# Patient Record
Sex: Female | Born: 2000 | Race: White | Hispanic: No | Marital: Single | Smoking: Never smoker
Health system: Southern US, Community
[De-identification: ages and names within clinical notes are randomized; demographics above are authoritative.]

## PROBLEM LIST (undated history)

## (undated) DIAGNOSIS — R0602 Shortness of breath: Secondary | ICD-10-CM

## (undated) DIAGNOSIS — M2550 Pain in unspecified joint: Secondary | ICD-10-CM

## (undated) DIAGNOSIS — F32A Depression, unspecified: Secondary | ICD-10-CM

## (undated) DIAGNOSIS — M549 Dorsalgia, unspecified: Secondary | ICD-10-CM

## (undated) DIAGNOSIS — T7840XA Allergy, unspecified, initial encounter: Secondary | ICD-10-CM

## (undated) DIAGNOSIS — L309 Dermatitis, unspecified: Secondary | ICD-10-CM

## (undated) DIAGNOSIS — K589 Irritable bowel syndrome without diarrhea: Secondary | ICD-10-CM

## (undated) DIAGNOSIS — D649 Anemia, unspecified: Secondary | ICD-10-CM

## (undated) HISTORY — DX: Irritable bowel syndrome, unspecified: K58.9

## (undated) HISTORY — DX: Pain in unspecified joint: M25.50

## (undated) HISTORY — DX: Dorsalgia, unspecified: M54.9

## (undated) HISTORY — DX: Depression, unspecified: F32.A

## (undated) HISTORY — DX: Shortness of breath: R06.02

## (undated) HISTORY — DX: Allergy, unspecified, initial encounter: T78.40XA

## (undated) HISTORY — PX: MYRINGOTOMY: SUR874

## (undated) HISTORY — DX: Anemia, unspecified: D64.9

---
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
## 2022-04-17 NOTE — Progress Notes (Unsigned)
HPI: [name] is a 21 y.o. female, who is here today with her mother for her routine physical.  Last CPE: > A year ago. She recently established with gyn.  She exercises regularly, walks her dog, runs on the treadmill, and active at school. In general she follows a healthful diet.  Chronic medical problems: Depression,anxiety, and asthme among some.  Immunization History  Administered Date(s) Administered   DTaP 02/07/2003, 04/10/2003, 06/19/2003, 07/10/2004, 01/25/2007   Hepatitis A 12/07/2005, 01/25/2007   Hepatitis B 22-Jul-2003, 01/08/2003, 10/04/2003   HiB (PRP-OMP) 02/07/2003, 04/10/2003, 06/19/2003, 03/19/2004   Hpv-Unspecified 04/29/2015, 07/04/2015, 11/04/2015   IPV 02/07/2003, 04/10/2003, 07/10/2004, 01/25/2007   Influenza,inj,Quad PF,6+ Mos 08/10/2017, 07/18/2019, 08/04/2021   Influenza,inj,quad, With Preservative 08/08/2014   MMR 12/24/2003, 01/25/2007   Meningococcal Conjugate 04/29/2015   Pneumococcal Conjugate-13 02/07/2003, 04/10/2003, 06/19/2003, 03/19/2004   Tdap 04/29/2015   Varicella 12/24/2003, 01/25/2007   Health Maintenance  Topic Date Due   Hepatitis C Screening  Never done   COVID-19 Vaccine (1) 05/06/2022 (Originally 06/10/2003)   HIV Screening  03/07/2023 (Originally 12/07/2017)   INFLUENZA VACCINE  04/28/2022   TETANUS/TDAP  04/28/2025   HPV VACCINES  Completed    Review of Systems  Constitutional:  Negative for activity change, appetite change and fever.  HENT:  Negative for hearing loss, mouth sores, sore throat and trouble swallowing.   Eyes:  Negative for redness and visual disturbance.  Respiratory:  Negative for cough, shortness of breath and wheezing.   Cardiovascular:  Negative for chest pain and leg swelling.  Gastrointestinal:  Negative for abdominal pain, nausea and vomiting.       No changes in bowel habits.  Endocrine: Negative for cold intolerance, heat intolerance, polydipsia, polyphagia and polyuria.  Genitourinary:   Negative for decreased urine volume, dysuria, hematuria, vaginal bleeding and vaginal discharge.  Musculoskeletal:  Negative for gait problem and myalgias.  Skin:  Negative for color change and rash.  Allergic/Immunologic: Negative for environmental allergies.  Neurological:  Negative for seizures, syncope, weakness and headaches.  Hematological:  Negative for adenopathy. Does not bruise/bleed easily.  Psychiatric/Behavioral:  Negative for confusion. The patient is not nervous/anxious.   All other systems reviewed and are negative.  Current Outpatient Medications on File Prior to Visit  Medication Sig Dispense Refill   venlafaxine XR (EFFEXOR-XR) 37.5 MG 24 hr capsule TAKE 1 CAPSULE BY MOUTH DAILY WITH BREAKFAST. 90 capsule 1   No current facility-administered medications on file prior to visit.   Past Medical History:  Diagnosis Date   Allergy    Eczema    Past Surgical History:  Procedure Laterality Date   MYRINGOTOMY     No Known Allergies  Family History  Problem Relation Age of Onset   Mental illness Mother    Hypertension Mother    Asthma Father    Social History   Socioeconomic History   Marital status: Single    Spouse name: Not on file   Number of children: Not on file   Years of education: Not on file   Highest education level: Some college, no degree  Occupational History   Not on file  Tobacco Use   Smoking status: Never   Smokeless tobacco: Never  Substance and Sexual Activity   Alcohol use: No   Drug use: No   Sexual activity: Never  Other Topics Concern   Not on file  Social History Narrative   Not on file   Social Determinants of Health   Financial Resource  Strain: Low Risk  (04/20/2022)   Overall Financial Resource Strain (CARDIA)    Difficulty of Paying Living Expenses: Not hard at all  Food Insecurity: No Food Insecurity (04/20/2022)   Hunger Vital Sign    Worried About Running Out of Food in the Last Year: Never true    Ran Out of Food  in the Last Year: Never true  Transportation Needs: No Transportation Needs (04/20/2022)   PRAPARE - Hydrologist (Medical): No    Lack of Transportation (Non-Medical): No  Physical Activity: Sufficiently Active (04/20/2022)   Exercise Vital Sign    Days of Exercise per Week: 3 days    Minutes of Exercise per Session: 60 min  Stress: Stress Concern Present (04/20/2022)   [place]    Feeling of Stress : Rather much  Social Connections: Unknown (04/20/2022)   Social Connection and Isolation Panel [NHANES]    Frequency of Communication with Friends and Family: More than three times a week    Frequency of Social Gatherings with Friends and Family: Once a week    Attends Religious Services: Patient refused    Active Member of Clubs or Organizations: No    Attends Archivist Meetings: Not on file    Marital Status: Patient refused   Vitals:   04/20/22 1351  BP: 132/80  Pulse: 72  Resp: 12  Temp: 98.3 F (36.8 C)  SpO2: 92%  Body mass index is 41.3 kg/m. Wt Readings from Last 3 Encounters:  04/20/22 258 lb 8 oz (117.3 kg) (>99 %, Z= 2.56)*  05/09/18 214 lb 2 oz (97.1 kg) (99 %, Z= 2.30)*  04/06/18 228 lb 8 oz (103.6 kg) (>99 %, Z= 2.46)*   * Growth percentiles are based on CDC (Girls, 2-20 Years) data.   Physical Exam Vitals and nursing note reviewed.  Constitutional:      General: She is not in acute distress.    Appearance: She is well-developed.  HENT:     Head: Normocephalic and atraumatic.     Right Ear: Hearing, tympanic membrane, ear canal and external ear normal.     Left Ear: Hearing, tympanic membrane, ear canal and external ear normal.     Mouth/Throat:     Mouth: Mucous membranes are moist.     Pharynx: Oropharynx is clear. Uvula midline.  Eyes:     Extraocular Movements: Extraocular movements intact.     Conjunctiva/sclera: Conjunctivae normal.     Pupils:  Pupils are equal, round, and reactive to light.  Neck:     Thyroid: No thyromegaly.     Trachea: No tracheal deviation.  Cardiovascular:     Rate and Rhythm: Normal rate and regular rhythm.     Pulses:          Dorsalis pedis pulses are 2+ on the right side and 2+ on the left side.     Heart sounds: No murmur heard. Pulmonary:     Effort: Pulmonary effort is normal. No respiratory distress.     Breath sounds: Normal breath sounds.  Abdominal:     Palpations: Abdomen is soft. There is no hepatomegaly or mass.     Tenderness: There is no abdominal tenderness.  Genitourinary:    Comments: Deferred to gyn. Musculoskeletal:     Comments: No major deformity or signs of synovitis appreciated.  Lymphadenopathy:     Cervical: No cervical adenopathy.     Upper Body:  Right upper body: No supraclavicular adenopathy.     Left upper body: No supraclavicular adenopathy.  Skin:    General: Skin is warm.     Findings: No erythema or rash.  Neurological:     General: No focal deficit present.     Mental Status: She is alert and oriented to person, place, and time.     Cranial Nerves: No cranial nerve deficit.     Coordination: Coordination normal.     Gait: Gait normal.     Deep Tendon Reflexes:     Reflex Scores:      Bicep reflexes are 2+ on the right side and 2+ on the left side.      Patellar reflexes are 2+ on the right side and 2+ on the left side. Psychiatric:        Mood and Affect: Mood is anxious.     Comments: Well groomed, good eye contact.   ASSESSMENT AND PLAN:  [name] was here today annual physical examination.  Orders Placed This Encounter  Procedures   Hepatitis C antibody screen   Basic metabolic panel   Hemoglobin A1c   Lipid panel   TSH   Hepatic function panel   Amb Ref to Medical Weight Management   Carol Walton was seen today for annual exam.  Diagnoses and all orders for this visit:  Routine general medical examination at a health care  facility  Depression, major, recurrent, mild ([place])  Encounter for HCV screening test for low risk patient -     Hepatitis C antibody screen; Future  Screening for endocrine, metabolic and immunity disorder -     Basic metabolic panel; Future -     Hemoglobin A1c; Future  Nausea and vomiting in adult patient -     TSH; Future -     Hepatic function panel; Future  Encounter for screening for lipid disorder -     Lipid panel; Future  FHx: hypercholesterolemia -     Lipid panel; Future  Morbid obesity ([place]) -     Amb Ref to Medical Weight Management  No problem-specific Assessment & Plan notes found for this encounter.  Return in 6 months (on 10/21/2022) for anxiety and depression.  Ronav Furney G. [place], MD  [facility]. [place] office.

## 2022-04-20 ENCOUNTER — Other Ambulatory Visit: Payer: Self-pay

## 2022-04-20 ENCOUNTER — Ambulatory Visit (INDEPENDENT_AMBULATORY_CARE_PROVIDER_SITE_OTHER): Payer: BC Managed Care – PPO | Admitting: Family Medicine

## 2022-04-20 VITALS — BP 132/80 | HR 72 | Temp 98.3°F | Resp 12 | Ht 66.34 in | Wt 258.5 lb

## 2022-04-20 DIAGNOSIS — Z130 Encounter for screening for diseases of the blood and blood-forming organs and certain disorders involving the immune mechanism: Secondary | ICD-10-CM

## 2022-04-20 DIAGNOSIS — Z13220 Encounter for screening for lipoid disorders: Secondary | ICD-10-CM | POA: Diagnosis not present

## 2022-04-20 DIAGNOSIS — F330 Major depressive disorder, recurrent, mild: Secondary | ICD-10-CM | POA: Diagnosis not present

## 2022-04-20 DIAGNOSIS — Z1329 Encounter for screening for other suspected endocrine disorder: Secondary | ICD-10-CM | POA: Diagnosis not present

## 2022-04-20 DIAGNOSIS — Z13228 Encounter for screening for other metabolic disorders: Secondary | ICD-10-CM

## 2022-04-20 DIAGNOSIS — Z1159 Encounter for screening for other viral diseases: Secondary | ICD-10-CM

## 2022-04-20 DIAGNOSIS — Z8342 Family history of familial hypercholesterolemia: Secondary | ICD-10-CM | POA: Diagnosis not present

## 2022-04-20 DIAGNOSIS — Z0000 Encounter for general adult medical examination without abnormal findings: Secondary | ICD-10-CM | POA: Diagnosis not present

## 2022-04-20 DIAGNOSIS — R112 Nausea with vomiting, unspecified: Secondary | ICD-10-CM

## 2022-04-20 LAB — LIPID PANEL
Cholesterol: 251 mg/dL — ABNORMAL HIGH (ref 0–200)
HDL: 50.8 mg/dL (ref >39.00)
LDL Cholesterol: 181 mg/dL — ABNORMAL HIGH (ref 0–99)
NonHDL: 200.36
Total CHOL/HDL Ratio: 5
Triglycerides: 98 mg/dL (ref 0.0–149.0)
VLDL: 19.6 mg/dL (ref 0.0–40.0)

## 2022-04-20 LAB — HEPATIC FUNCTION PANEL
ALT: 30 U/L (ref 0–35)
AST: 27 U/L (ref 0–37)
Albumin: 4.7 g/dL (ref 3.5–5.2)
Alkaline Phosphatase: 65 U/L (ref 47–119)
Bilirubin, Direct: 0.1 mg/dL (ref 0.0–0.3)
Total Bilirubin: 0.3 mg/dL (ref 0.2–1.2)
Total Protein: 8 g/dL (ref 6.0–8.3)

## 2022-04-20 LAB — BASIC METABOLIC PANEL
BUN: 9 mg/dL (ref 6–23)
CO2: 26 mEq/L (ref 19–32)
Calcium: 10 mg/dL (ref 8.4–10.5)
Chloride: 101 mEq/L (ref 96–112)
Creatinine, Ser: 0.63 mg/dL (ref 0.40–1.20)
GFR: 128.65 mL/min (ref >60.00)
Glucose, Bld: 89 mg/dL (ref 70–99)
Potassium: 4.7 mEq/L (ref 3.5–5.1)
Sodium: 137 mEq/L (ref 135–145)

## 2022-04-20 LAB — CBC
HCT: 40.5 % (ref 36.0–49.0)
Hemoglobin: 13.4 g/dL (ref 12.0–16.0)
MCHC: 33 g/dL (ref 31.0–37.0)
MCV: 80.1 fl (ref 78.0–98.0)
Platelets: 277 10*3/uL (ref 150.0–575.0)
RBC: 5.05 Mil/uL (ref 3.80–5.70)
RDW: 14 % (ref 11.4–15.5)
WBC: 9.6 10*3/uL (ref 4.5–13.5)

## 2022-04-20 LAB — HEMOGLOBIN A1C: Hgb A1c MFr Bld: 5.7 % (ref 4.6–6.5)

## 2022-04-20 LAB — TSH: TSH: 1.59 u[IU]/mL (ref 0.40–5.00)

## 2022-04-20 NOTE — Patient Instructions (Addendum)
A few things to remember from today's visit:  Routine general medical examination at a health care facility  Depression, major, recurrent, mild ([place])  Encounter for HCV screening test for low risk patient - Plan: Hepatitis C antibody screen  Screening for endocrine, metabolic and immunity disorder - Plan: Basic metabolic panel, Hemoglobin A1c  Nausea and vomiting in adult patient - Plan: TSH, Hepatic function panel, CBC  Encounter for screening for lipid disorder - Plan: Lipid panel  FHx: hypercholesterolemia - Plan: Lipid panel  Obesity ([place]) - Plan: Amb Ref to Medical Weight Management  If you need refills please call your pharmacy. Do not use My Chart to request refills or for acute issues that need immediate attention.   Please be sure medication list is accurate. If a new problem present, please set up appointment sooner than planned today.  Health Maintenance, Female Adopting a healthy lifestyle and getting preventive care are important in promoting health and wellness. Ask your health care provider about: The right schedule for you to have regular tests and exams. Things you can do on your own to prevent diseases and keep yourself healthy. What should I know about diet, weight, and exercise? Eat a healthy diet  Eat a diet that includes plenty of vegetables, fruits, low-fat dairy products, and lean protein. Do not eat a lot of foods that are high in solid fats, added sugars, or sodium. Maintain a healthy weight Body mass index (BMI) is used to identify weight problems. It estimates body fat based on height and weight. Your health care provider can help determine your BMI and help you achieve or maintain a healthy weight. Get regular exercise Get regular exercise. This is one of the most important things you can do for your health. Most adults should: Exercise for at least 150 minutes each week. The exercise should increase your heart rate and make you sweat  (moderate-intensity exercise). Do strengthening exercises at least twice a week. This is in addition to the moderate-intensity exercise. Spend less time sitting. Even light physical activity can be beneficial. Watch cholesterol and blood lipids Have your blood tested for lipids and cholesterol at 19 years of age, then have this test every 5 years. Have your cholesterol levels checked more often if: Your lipid or cholesterol levels are high. You are older than 19 years of age. You are at high risk for heart disease. What should I know about cancer screening? Depending on your health history and family history, you may need to have cancer screening at various ages. This may include screening for: Breast cancer. Cervical cancer. Colorectal cancer. Skin cancer. Lung cancer. What should I know about heart disease, diabetes, and high blood pressure? Blood pressure and heart disease High blood pressure causes heart disease and increases the risk of stroke. This is more likely to develop in people who have high blood pressure readings or are overweight. Have your blood pressure checked: Every 3-5 years if you are 71-41 years of age. Every year if you are 67 years old or older. Diabetes Have regular diabetes screenings. This checks your fasting blood sugar level. Have the screening done: Once every three years after age 57 if you are at a normal weight and have a low risk for diabetes. More often and at a younger age if you are overweight or have a high risk for diabetes. What should I know about preventing infection? Hepatitis B If you have a higher risk for hepatitis B, you should be screened for this  virus. Talk with your health care provider to find out if you are at risk for hepatitis B infection. Hepatitis C Testing is recommended for: Everyone born from [birth year] through 1963. Anyone with known risk factors for hepatitis C. Sexually transmitted infections (STIs) Get screened for STIs,  including gonorrhea and chlamydia, if: You are sexually active and are younger than 19 years of age. You are older than 19 years of age and your health care provider tells you that you are at risk for this type of infection. Your sexual activity has changed since you were last screened, and you are at increased risk for chlamydia or gonorrhea. Ask your health care provider if you are at risk. Ask your health care provider about whether you are at high risk for HIV. Your health care provider may recommend a prescription medicine to help prevent HIV infection. If you choose to take medicine to prevent HIV, you should first get tested for HIV. You should then be tested every 3 months for as long as you are taking the medicine. Pregnancy If you are about to stop having your period (premenopausal) and you may become pregnant, seek counseling before you get pregnant. Take 400 to 800 micrograms (mcg) of folic acid every day if you become pregnant. Ask for birth control (contraception) if you want to prevent pregnancy. Osteoporosis and menopause Osteoporosis is a disease in which the bones lose minerals and strength with aging. This can result in bone fractures. If you are 19 years old or older, or if you are at risk for osteoporosis and fractures, ask your health care provider if you should: Be screened for bone loss. Take a calcium or vitamin D supplement to lower your risk of fractures. Be given hormone replacement therapy (HRT) to treat symptoms of menopause. Follow these instructions at home: Alcohol use Do not drink alcohol if: Your health care provider tells you not to drink. You are pregnant, may be pregnant, or are planning to become pregnant. If you drink alcohol: Limit how much you have to: 0-1 drink a day. Know how much alcohol is in your drink. In the [place] one drink equals one 12 oz bottle of beer (355 mL), one 5 oz glass of wine (148 mL), or one 1 oz glass of hard liquor (44  mL). Lifestyle Do not use any products that contain nicotine or tobacco. These products include cigarettes, chewing tobacco, and vaping devices, such as e-cigarettes. If you need help quitting, ask your health care provider. Do not use street drugs. Do not share needles. Ask your health care provider for help if you need support or information about quitting drugs. General instructions Schedule regular health, dental, and eye exams. Stay current with your vaccines. Tell your health care provider if: You often feel depressed. You have ever been abused or do not feel safe at home. Summary Adopting a healthy lifestyle and getting preventive care are important in promoting health and wellness. Follow your health care provider's instructions about healthy diet, exercising, and getting tested or screened for diseases. Follow your health care provider's instructions on monitoring your cholesterol and blood pressure. This information is not intended to replace advice given to you by your health care provider. Make sure you discuss any questions you have with your health care provider. Document Revised: 02/03/2021 Document Reviewed: 02/03/2021 Elsevier Patient Education  2023 Elsevier Inc.   MyPlate from [employer] is an outline of a general healthy diet based on the Dietary Guidelines for Americans, 2020-2025,  from the [place] Department of Agriculture Architect). It sets guidelines for how much food you should eat from each food group based on your age, sex, and level of physical activity. What are tips for following MyPlate? To follow MyPlate recommendations: Eat a wide variety of fruits and vegetables, grains, and protein foods. Serve smaller portions and eat less food throughout the day. Limit portion sizes to avoid overeating. Enjoy your food. Get at least 150 minutes of exercise every week. This is about 30 minutes each day, 5 or more days per week. It can be difficult to have every meal  look like MyPlate. Think about MyPlate as eating guidelines for an entire day, rather than each individual meal. Fruits and vegetables Make one half of your plate fruits and vegetables. Eat many different colors of fruits and vegetables each day. For a 2,000-calorie daily food plan, eat: 2 cups of vegetables every day. 2 cups of fruit every day. 1 cup is equal to: 1 cup raw or cooked vegetables. 1 cup raw fruit. 1 medium-sized orange, apple, or banana. 1 cup 100% fruit or vegetable juice. 2 cups raw leafy greens, such as lettuce, spinach, or kale.  cup dried fruit. Grains One fourth of your plate should be grains. Make at least half of the grains you eat each day whole grains. For a 2,000-calorie daily food plan, eat 6 oz of grains every day. 1 oz is equal to: 1 slice bread. 1 cup cereal.  cup cooked rice, cereal, or pasta. Protein One fourth of your plate should be protein. Eat a wide variety of protein foods, including meat, poultry, fish, eggs, beans, nuts, and tofu. For a 2,000-calorie daily food plan, eat 5 oz of protein every day. 1 oz is equal to: 1 oz meat, poultry, or fish.  cup cooked beans. 1 egg.  oz nuts or seeds. 1 Tbsp peanut butter. Dairy Drink fat-free or low-fat (1%) milk. Eat or drink dairy as a side to meals. For a 2,000-calorie daily food plan, eat or drink 3 cups of dairy every day. 1 cup is equal to: 1 cup milk, yogurt, cottage cheese, or soy milk (soy beverage). 2 oz processed cheese. 1 oz natural cheese. Fats, oils, salt, and sugars Only small amounts of oils are recommended. Avoid foods that are high in calories and low in nutritional value (empty calories), like foods high in fat or added sugars. Choose foods that are low in salt (sodium). Choose foods that have less than 140 milligrams (mg) of sodium per serving. Drink water instead of sugary drinks. Drink enough fluid to keep your urine pale yellow. Where to find support Work with your  health care provider or a dietitian to develop a customized eating plan that is right for you. Download an app (mobile application) to help you track your daily food intake. Where to find more information USDA: https://www.[place].org/ Summary MyPlate is a general guideline for healthy eating from the USDA. It is based on the Dietary Guidelines for Americans, 2020-2025. In general, fruits and vegetables should take up one half of your plate, grains should take up one fourth of your plate, and protein should take up one fourth of your plate. This information is not intended to replace advice given to you by your health care provider. Make sure you discuss any questions you have with your health care provider. Document Revised: 08/05/2020 Document Reviewed: 08/05/2020 Elsevier Patient Education  2023 [employer].

## 2022-04-21 LAB — HEPATITIS C ANTIBODY: Hepatitis C Ab: NONREACTIVE

## 2022-04-22 ENCOUNTER — Encounter: Payer: Self-pay | Admitting: Family Medicine

## 2022-04-27 ENCOUNTER — Other Ambulatory Visit: Payer: Self-pay | Admitting: Family Medicine

## 2022-04-27 DIAGNOSIS — J4520 Mild intermittent asthma, uncomplicated: Secondary | ICD-10-CM

## 2022-04-27 MED ORDER — ALBUTEROL SULFATE HFA 108 (90 BASE) MCG/ACT IN AERS: 2.0000 | INHALATION_SPRAY | Freq: Four times a day (QID) | RESPIRATORY_TRACT | 1 refills | Status: DC | PRN

## 2022-05-07 DIAGNOSIS — Z0289 Encounter for other administrative examinations: Secondary | ICD-10-CM

## 2022-05-12 ENCOUNTER — Encounter (INDEPENDENT_AMBULATORY_CARE_PROVIDER_SITE_OTHER): Payer: Self-pay | Admitting: Family Medicine

## 2022-05-12 ENCOUNTER — Ambulatory Visit (INDEPENDENT_AMBULATORY_CARE_PROVIDER_SITE_OTHER): Payer: BC Managed Care – PPO | Admitting: Family Medicine

## 2022-05-12 VITALS — BP 115/74 | HR 95 | Temp 98.5°F | Ht 66.0 in | Wt 257.0 lb

## 2022-05-12 DIAGNOSIS — E7849 Other hyperlipidemia: Secondary | ICD-10-CM

## 2022-05-12 DIAGNOSIS — R0602 Shortness of breath: Secondary | ICD-10-CM

## 2022-05-12 DIAGNOSIS — R7303 Prediabetes: Secondary | ICD-10-CM

## 2022-05-12 DIAGNOSIS — D649 Anemia, unspecified: Secondary | ICD-10-CM

## 2022-05-12 DIAGNOSIS — R5383 Other fatigue: Secondary | ICD-10-CM | POA: Diagnosis not present

## 2022-05-12 DIAGNOSIS — Z1331 Encounter for screening for depression: Secondary | ICD-10-CM | POA: Diagnosis not present

## 2022-05-12 DIAGNOSIS — Z6841 Body Mass Index (BMI) 40.0 and over, adult: Secondary | ICD-10-CM

## 2022-05-12 DIAGNOSIS — F419 Anxiety disorder, unspecified: Secondary | ICD-10-CM

## 2022-05-12 MED ORDER — BUSPIRONE HCL 5 MG PO TABS: 5.0000 mg | ORAL_TABLET | Freq: Three times a day (TID) | ORAL | 0 refills | Status: DC | PRN

## 2022-05-13 ENCOUNTER — Telehealth: Payer: Self-pay | Admitting: Family Medicine

## 2022-05-13 LAB — ANEMIA PANEL
Ferritin: 27 ng/mL (ref 15–77)
Folate, Hemolysate: 380 ng/mL
Folate, RBC: 894 ng/mL (ref >498)
Hematocrit: 42.5 % (ref 34.0–46.6)
Iron Saturation: 8 % — CL (ref 15–55)
Iron: 25 ug/dL — ABNORMAL LOW (ref 27–159)
Retic Ct Pct: 1.5 % (ref 0.6–2.6)
Total Iron Binding Capacity: 332 ug/dL (ref 250–450)
UIBC: 307 ug/dL (ref 131–425)
Vitamin B-12: 446 pg/mL (ref 232–1245)

## 2022-05-13 LAB — FOLATE: Folate: 4.7 ng/mL (ref >3.0)

## 2022-05-13 LAB — T4, FREE: Free T4: 1.58 ng/dL (ref 0.93–1.60)

## 2022-05-13 LAB — INSULIN, RANDOM: INSULIN: 29.1 u[IU]/mL — ABNORMAL HIGH (ref 2.6–24.9)

## 2022-05-13 LAB — VITAMIN D 25 HYDROXY (VIT D DEFICIENCY, FRACTURES): Vit D, 25-Hydroxy: 55.1 ng/mL (ref 30.0–100.0)

## 2022-05-13 LAB — T3: T3, Total: 284 ng/dL — ABNORMAL HIGH (ref 71–180)

## 2022-05-13 NOTE — Telephone Encounter (Signed)
error 

## 2022-05-17 ENCOUNTER — Other Ambulatory Visit: Payer: Self-pay | Admitting: Family Medicine

## 2022-05-17 DIAGNOSIS — F419 Anxiety disorder, unspecified: Secondary | ICD-10-CM

## 2022-05-17 DIAGNOSIS — F330 Major depressive disorder, recurrent, mild: Secondary | ICD-10-CM

## 2022-05-20 NOTE — Progress Notes (Signed)
Chief Complaint:   OBESITY Carol Walton (MR# [id number]) is a 21 y.o. female who presents for evaluation and treatment of obesity and related comorbidities. Current BMI is Body mass index is 41.48 kg/m. Carol Walton has been struggling with her weight for many years and has been unsuccessful in either losing weight, maintaining weight loss, or reaching her healthy weight goal.  Carol Walton referred by Dr. Swaziland. She is on Adipex in 2019 and lost 14 lbs. She skips breakfast, 1/2 jug of water +/- glass of milk. Lunch at 12pm, Salmon and broccoli (3.5 oz and 1/2 cup) (satisfied). Snack at 3pm--fruit or chips and queso (1 cup chips and store brought queso). Dinner is burger-at 3 oz + provolone and broccoli or brussel sprouts (1 1/4 cups) (feels full). 9-10 pm, snack of keto ice cream bar or splitting bag of popcorn with dad or brother.  Carol Walton is currently in the action stage of change and ready to dedicate time achieving and maintaining a healthier weight. Carol Walton is interested in becoming our patient and working on intensive lifestyle modifications including (but not limited to) diet and exercise for weight loss.  Carol Walton's habits were reviewed today and are as follows: Her family eats meals together, she thinks her family will eat healthier with her, her desired weight loss is 58 lbs, she has been heavy most of her life, she started gaining weight in highschool, her heaviest weight ever was 258 pounds, she has significant food cravings issues, she snacks frequently in the evenings, she wakes up frequently in the middle of the night to eat, she frequently eats larger portions than normal, and she has binge eating behaviors.  Depression Screen Carol Walton's Food and Mood (modified PHQ-9) score was 11.     05/12/2022    9:18 AM  Depression screen PHQ 2/9  Decreased Interest 2  Down, Depressed, Hopeless 2  PHQ - 2 Score 4  Altered sleeping 1  Tired, decreased energy 3  Change in appetite 1  Feeling  bad or failure about yourself  2  Trouble concentrating 0  Moving slowly or fidgety/restless 0  Suicidal thoughts 0  PHQ-9 Score 11  Difficult doing work/chores Very difficult   Subjective:   1. Other fatigue Carol Walton admits to daytime somnolence and admits to waking up still tired. Patient has a history of symptoms of morning fatigue. Carol Walton generally gets 6 or 7 hours of sleep per night, and states that she has generally restful sleep. Snoring is not present. Apneic episodes are not present. Epworth Sleepiness Score is 8.   2. SOBOE (shortness of breath on exertion) Carol Walton notes increasing shortness of breath with exercising and seems to be worsening over time with weight gain. She notes getting out of breath sooner with activity than she used to. This has not gotten worse recently. Carol Walton denies shortness of breath at rest or orthopnea.  3. Prediabetes Carol Walton A1c was 5.7. Family history of diabetes without prior A1c.  4. Other hyperlipidemia Carol Walton's LDL at 181, HDL at 50, and Trigly at 98. Both her parents with elevated cholesterol and taking medication.  5. Anemia, unspecified type MCV borderline low H/H within normal limits. Notes fatigue.  6. Anxiety Carol Walton is currently on Effexor 37.5 mg (Previously on Prozac, and Lexapro). Gets therapy every other week.   Assessment/Plan:   1. Other fatigue We will obtain labs today. Carol Walton does feel that her weight is causing her energy to be lower than it should be. Fatigue may be related  to obesity, depression or many other causes. Labs will be ordered, and in the meanwhile, Carol Walton will focus on self care including making healthy food choices, increasing physical activity and focusing on stress reduction.  - EKG 12-Lead - VITAMIN D 25 Hydroxy (Vit-D Deficiency, Fractures) - Vitamin B12 - Folate - T3 - T4, free  2. SOBOE (shortness of breath on exertion) Carol Walton does feel that she gets out of breath more easily that she used to when  she exercises. Carol Walton's shortness of breath appears to be obesity related and exercise induced. She has agreed to work on weight loss and gradually increase exercise to treat her exercise induced shortness of breath. Will continue to monitor closely.  3. Prediabetes We will obtain labs today.  - Insulin, random  4. Other hyperlipidemia We will follow up labs in 3 months.  5. Anemia, unspecified type We will obtain labs today.  - Anemia panel  6. Anxiety Start Buspar 5 mg by mouth three times a day as needed for 1 month with 0 refills. Will follow up on symptoms at next appointment.  -Start busPIRone (BUSPAR) 5 MG tablet; Take 1 tablet (5 mg total) by mouth 3 (three) times daily as needed (anxiety).  Dispense: 45 tablet; Refill: 0  7. Depression screening Carol Walton had a positive depression screening. Depression is commonly associated with obesity and often results in emotional eating behaviors. We will monitor this closely and work on CBT to help improve the non-hunger eating patterns. Referral to Psychology may be required if no improvement is seen as she continues in our clinic.  8. Class 3 severe obesity with serious comorbidity and body mass index (BMI) of 40.0 to 44.9 in adult, unspecified obesity type (HCC) Carol Walton is currently in the action stage of change and her goal is to continue with weight loss efforts. I recommend Carol Walton begin the structured treatment plan as follows:  She has agreed to the Category 4 Plan.  Exercise goals: No exercise has been prescribed at this time.   Behavioral modification strategies: increasing lean protein intake, meal planning and cooking strategies, keeping healthy foods in the home, and planning for success.  She was informed of the importance of frequent follow-up visits to maximize her success with intensive lifestyle modifications for her multiple health conditions. She was informed we would discuss her lab results at her next visit unless there  is a critical issue that needs to be addressed sooner. Carol Walton agreed to keep her next visit at the agreed upon time to discuss these results.  Objective:   Blood pressure 115/74, pulse 95, temperature 98.5 F (36.9 C), height 5\' 6"  (1.676 m), weight 257 lb (116.6 kg), last menstrual period 04/20/2022, SpO2 98 %. Body mass index is 41.48 kg/m.  EKG: Normal sinus rhythm, rate 92 bpm.  Indirect Calorimeter completed today shows a VO2 of 335 and a REE of 2318.  Her calculated basal metabolic rate is 04/22/2022 thus her basal metabolic rate is better than expected.  General: Cooperative, alert, well developed, in no acute distress. HEENT: Conjunctivae and lids unremarkable. Cardiovascular: Regular rhythm.  Lungs: Normal work of breathing. Neurologic: No focal deficits.   Lab Results  Component Value Date   CREATININE 0.63 04/20/2022   BUN 9 04/20/2022   NA 137 04/20/2022   K 4.7 04/20/2022   CL 101 04/20/2022   CO2 26 04/20/2022   Lab Results  Component Value Date   ALT 30 04/20/2022   AST 27 04/20/2022   ALKPHOS 65 04/20/2022  BILITOT 0.3 04/20/2022   Lab Results  Component Value Date   HGBA1C 5.7 04/20/2022   Lab Results  Component Value Date   INSULIN 29.1 (H) 05/12/2022   Lab Results  Component Value Date   TSH 1.59 04/20/2022   Lab Results  Component Value Date   CHOL 251 (H) 04/20/2022   HDL 50.80 04/20/2022   LDLCALC 181 (H) 04/20/2022   TRIG 98.0 04/20/2022   CHOLHDL 5 04/20/2022   Lab Results  Component Value Date   WBC 9.6 04/20/2022   HGB 13.4 04/20/2022   HCT 42.5 05/12/2022   MCV 80.1 04/20/2022   PLT 277.0 04/20/2022   Lab Results  Component Value Date   IRON 25 (L) 05/12/2022   TIBC 332 05/12/2022   FERRITIN 27 05/12/2022   Attestation Statements:   Reviewed by clinician on day of visit: allergies, medications, problem list, medical history, surgical history, family history, social history, and previous encounter notes.  I, Fortino Sic,  RMA am acting as transcriptionist for Reuben Likes, MD.  This is the patient's first visit at [facility]. The patient's NEW PATIENT PACKET was reviewed at length. Included in the packet: current and past health history, medications, allergies, ROS, gynecologic history (women only), surgical history, family history, social history, weight history, weight loss surgery history (for those that have had weight loss surgery), nutritional evaluation, mood and food questionnaire, PHQ9, Epworth questionnaire, sleep habits questionnaire, patient life and health improvement goals questionnaire. These will all be scanned into the patient's chart under media.   During the visit, I independently reviewed the patient's EKG, bioimpedance scale results, and indirect calorimeter results. I used this information to tailor a meal plan for the patient that will help her to lose weight and will improve her obesity-related conditions going forward. I performed a medically necessary appropriate examination and/or evaluation. I discussed the assessment and treatment plan with the patient. The patient was provided an opportunity to ask questions and all were answered. The patient agreed with the plan and demonstrated an understanding of the instructions. Labs were ordered at this visit and will be reviewed at the next visit unless more critical results need to be addressed immediately. Clinical information was updated and documented in the EMR.   Time spent on visit including pre-visit chart review and post-visit care was 42 minutes.     I have reviewed the above documentation for accuracy and completeness, and I agree with the above. - Reuben Likes, MD

## 2022-05-21 ENCOUNTER — Telehealth: Payer: Self-pay | Admitting: Family Medicine

## 2022-05-21 ENCOUNTER — Ambulatory Visit (INDEPENDENT_AMBULATORY_CARE_PROVIDER_SITE_OTHER): Payer: BC Managed Care – PPO | Admitting: Family Medicine

## 2022-05-21 ENCOUNTER — Encounter (INDEPENDENT_AMBULATORY_CARE_PROVIDER_SITE_OTHER): Payer: Self-pay | Admitting: Family Medicine

## 2022-05-21 VITALS — BP 124/78 | HR 78 | Temp 98.7°F | Ht 66.0 in | Wt 256.0 lb

## 2022-05-21 DIAGNOSIS — R946 Abnormal results of thyroid function studies: Secondary | ICD-10-CM

## 2022-05-21 DIAGNOSIS — D508 Other iron deficiency anemias: Secondary | ICD-10-CM | POA: Diagnosis not present

## 2022-05-21 DIAGNOSIS — R7303 Prediabetes: Secondary | ICD-10-CM | POA: Diagnosis not present

## 2022-05-21 DIAGNOSIS — F419 Anxiety disorder, unspecified: Secondary | ICD-10-CM

## 2022-05-21 DIAGNOSIS — E7849 Other hyperlipidemia: Secondary | ICD-10-CM | POA: Diagnosis not present

## 2022-05-21 DIAGNOSIS — Z6841 Body Mass Index (BMI) 40.0 and over, adult: Secondary | ICD-10-CM

## 2022-05-21 DIAGNOSIS — E669 Obesity, unspecified: Secondary | ICD-10-CM

## 2022-05-21 MED ORDER — PRENATAL MULTIVITAMIN CH: 1.0000 | ORAL_TABLET | Freq: Every day | ORAL | 0 refills | Status: DC

## 2022-05-21 NOTE — Telephone Encounter (Signed)
Pt states she was told by the Dr Lawson Radar that "why are you here? Your doctor should have just prescribed a weight loss medication for you." Pt does not feel that this doctor is helpful for her based on her questioning the patient. Pt is wondering if she needs to get another referral to a weight management program (preferably in [place] where she attends school) or if Dr Swaziland can prescribe [phone] for her. She states that she has investigated her insurance company will cover based on her BMI & prediabetic status.

## 2022-05-21 NOTE — Telephone Encounter (Signed)
Pt had appt with weight management today; no notes available at this time.  LVM for pt to return call to get clarification on what is needed.

## 2022-05-21 NOTE — Telephone Encounter (Signed)
Pt call and stated she want a new referral because the place that she go don't work for her and want a call back.

## 2022-05-22 NOTE — Addendum Note (Signed)
Addended by: Kathreen Devoid on: 05/22/2022 10:49 AM   Modules accepted: Orders

## 2022-05-26 ENCOUNTER — Other Ambulatory Visit (INDEPENDENT_AMBULATORY_CARE_PROVIDER_SITE_OTHER): Payer: Self-pay | Admitting: Family Medicine

## 2022-05-26 DIAGNOSIS — F419 Anxiety disorder, unspecified: Secondary | ICD-10-CM

## 2022-05-31 NOTE — Progress Notes (Signed)
Chief Complaint:   OBESITY Carol Walton is here to discuss her progress with her obesity treatment plan along with follow-up of her obesity related diagnoses. Carol Walton is on the Category 4 Plan and states she is following her eating plan approximately 45% of the time. Carol Walton states she is doing piliates for  45-60 minutes 2-3 times per week.  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 She is trying to work on getting breakfast in daily to try to get some nutrition in. She is back a t school and voices she will be traveling to and from school to [place] due to her grandfather's recent amputation.  Subjective:   1. Prediabetes Reviewed Carol Walton's labs: A1c - 5.7 and Insulin - 29.1. She is not taking any medications. Carol Walton has a history of GI issues like nausea and diarrhea.  2. Other iron deficiency anemia Reviewed Elfrida's labs: MCV - 80, Iron - 25, Ferritin - 27, MVI - 0, and Iron Saturation - 8.  3. Other hyperlipidemia Advised Carol Walton that her HDL is 50.8, LDL is 181, and Triglycerides are 98. Carol Walton has a family history of Hyperlipidemia.  4. Abnormal thyroid function test Advised Carol Walton that her T3 level is 284 (normal limit is up to 184). Her TSH and T4 are within normal limits. Carol Walton reports to have been under a significant amount of stress recently.  5. Anxiety Carol Walton is currently taking Effexor 37.5 mg daily. She reports to still having some overwhelming anxious features.  Assessment/Plan:   1. Prediabetes We will discuss starting Metformin once her GI symptoms improve. Carol Walton will continue with Category 4.  2. Other iron deficiency anemia Advised Carol Walton we will repeat labs in 3 months; she is to start prenatal vitamins as follows:  - Prenatal  Vit-Fe Fumarate-FA (PRENATAL MULTIVITAMIN) TABS tablet; Take 1 tablet by mouth daily at 12 noon.  Dispense: 90 tablet; Refill: 0  3. Other hyperlipidemia We will repeat labs in 3 months.  4. Abnormal thyroid function test We will repeat labs in 6 weeks.  5. Anxiety Carol Walton was encouraged to stat Buspar three times daily.  6. Obesity with current BMI of 41.3 Carol Walton is currently in the action stage of change. As such, her goal is to continue with weight loss efforts. She has agreed to the Category 4 Plan.   Exercise goals: All adults should avoid inactivity. Some physical activity is better than none, and adults who participate in any amount of physical activity gain some health benefits.  Behavioral modification strategies: increasing lean protein intake, meal planning and cooking strategies, keeping healthy foods in the home, and planning for success.  Carol Walton has agreed to follow-up with our clinic in 2 weeks. She was informed of the importance of frequent follow-up visits to maximize her success with intensive lifestyle modifications for her multiple health conditions.   Objective:   Blood pressure 124/78, pulse 78, temperature 98.7 F (37.1 C), height 5\' 6"  (1.676 m), weight 256 lb (116.1 kg), last menstrual period 04/20/2022, SpO2 99 %. Body mass index is 41.32 kg/m.  General: Cooperative, alert, well developed, in no acute distress. HEENT: Conjunctivae and lids unremarkable. Cardiovascular: Regular rhythm.  Lungs: Normal work of breathing. Neurologic: No focal deficits.   Lab Results  Component Value Date  CREATININE 0.63 04/20/2022   BUN 9 04/20/2022   NA 137 04/20/2022   K 4.7 04/20/2022   CL 101 04/20/2022   CO2 26 04/20/2022   Lab Results  Component Value Date   ALT 30 04/20/2022   AST 27 04/20/2022   ALKPHOS 65 04/20/2022   BILITOT 0.3 04/20/2022   Lab Results  Component Value Date   HGBA1C 5.7 04/20/2022   Lab Results  Component Value Date   INSULIN  29.1 (H) 05/12/2022   Lab Results  Component Value Date   TSH 1.59 04/20/2022   Lab Results  Component Value Date   CHOL 251 (H) 04/20/2022   HDL 50.80 04/20/2022   LDLCALC 181 (H) 04/20/2022   TRIG 98.0 04/20/2022   CHOLHDL 5 04/20/2022   Lab Results  Component Value Date   VD25OH 55.1 05/12/2022   Lab Results  Component Value Date   WBC 9.6 04/20/2022   HGB 13.4 04/20/2022   HCT 42.5 05/12/2022   MCV 80.1 04/20/2022   PLT 277.0 04/20/2022   Lab Results  Component Value Date   IRON 25 (L) 05/12/2022   TIBC 332 05/12/2022   FERRITIN 27 05/12/2022   Attestation Statements:   Reviewed by clinician on day of visit: allergies, medications, problem list, medical history, surgical history, family history, social history, and previous encounter notes. Total Time spent on pre and post visit charting, patient care and counseling: 45 minutes I, Paulla Fore, CMA, am acting as transcriptionist for Dr. Reuben Likes, MD.  I have reviewed the above documentation for accuracy and completeness, and I agree with the above. - Reuben Likes, MD

## 2022-06-11 ENCOUNTER — Ambulatory Visit (INDEPENDENT_AMBULATORY_CARE_PROVIDER_SITE_OTHER): Payer: BC Managed Care – PPO | Admitting: Family Medicine

## 2022-06-18 ENCOUNTER — Ambulatory Visit: Payer: BC Managed Care – PPO | Admitting: Bariatrics

## 2022-06-18 ENCOUNTER — Encounter: Payer: Self-pay | Admitting: Bariatrics

## 2022-06-18 VITALS — BP 129/78 | HR 76 | Temp 98.5°F | Ht 66.0 in | Wt 257.0 lb

## 2022-06-18 DIAGNOSIS — D508 Other iron deficiency anemias: Secondary | ICD-10-CM

## 2022-06-18 DIAGNOSIS — E669 Obesity, unspecified: Secondary | ICD-10-CM | POA: Diagnosis not present

## 2022-06-18 DIAGNOSIS — E7849 Other hyperlipidemia: Secondary | ICD-10-CM

## 2022-06-18 DIAGNOSIS — R7303 Prediabetes: Secondary | ICD-10-CM

## 2022-06-18 DIAGNOSIS — D649 Anemia, unspecified: Secondary | ICD-10-CM | POA: Insufficient documentation

## 2022-06-18 DIAGNOSIS — Z6841 Body Mass Index (BMI) 40.0 and over, adult: Secondary | ICD-10-CM

## 2022-06-18 MED ORDER — WEGOVY 0.25 MG/0.5ML ~~LOC~~ SOAJ: 0.2500 mg | SUBCUTANEOUS | 0 refills | Status: DC

## 2022-06-18 MED ORDER — POLYSACCHARIDE IRON COMPLEX 150 MG PO CAPS: 150.0000 mg | ORAL_CAPSULE | Freq: Every day | ORAL | 0 refills | Status: DC

## 2022-06-22 ENCOUNTER — Encounter: Payer: Self-pay | Admitting: Bariatrics

## 2022-06-22 ENCOUNTER — Telehealth (INDEPENDENT_AMBULATORY_CARE_PROVIDER_SITE_OTHER): Payer: Self-pay | Admitting: Bariatrics

## 2022-06-22 ENCOUNTER — Encounter (INDEPENDENT_AMBULATORY_CARE_PROVIDER_SITE_OTHER): Payer: Self-pay

## 2022-06-22 NOTE — Progress Notes (Signed)
Chief Complaint:   OBESITY Carol Walton is here to discuss her progress with her obesity treatment plan along with follow-up of her obesity related diagnoses. Carol Walton is on the Category 4 Plan and states she is following her eating plan approximately 0% of the time. Carol Walton states she is doing yoga, running, Pilates for 45 to 60 minutes 3-5 times per week.  Today's visit was #: 3 Starting weight: 257 lbs Starting date: 05/12/2022 Today's weight: 257 lbs Today's date: 06/18/22 Total lbs lost to date: 0 Total lbs lost since last in-office visit: +1  Interim History: She is up 1 pound since her last visit. She is not following a plan four.  Subjective:   1. Prediabetes No medication. Insulin-29.1. A1c-5.7.  2. Other hyperlipidemia No medication.  3. Other iron deficiency anemia Decrease in iron saturation.  Assessment/Plan:   1. Prediabetes Obtain prior authorization for [facility]. - Semaglutide-Weight Management (WEGOVY) 0.25 MG/0.5ML SOAJ; Inject 0.25 mg into the skin once a week.  Dispense: 2 mL; Refill: 0 1.  Walk 20 minutes after her evening meal.  2.  Metformin information sheet.  2. Other hyperlipidemia 1.  No trans fats. 2. Increase MUFAs and PUFAs.  3. Other iron deficiency anemia Prescription: - iron polysaccharides (NU-IRON) 150 MG capsule; Take 1 capsule (150 mg total) by mouth daily.  Dispense: 60 capsule; Refill: 0  4. Obesity, Current BMI 41.6 1.  Meal planning 2.  Intentional eating  Carol Walton is currently in the action stage of change. As such, her goal is to continue with weight loss efforts. She has agreed to keeping a food journal and adhering to recommended goals of 1500-1600 calories and 90-100 gms protein daily.  Exercise goals: Continue exercise-treadmill, Pilates 1 to 2 days/week and yoga-stretching.  Behavioral modification strategies: increasing lean protein intake, decreasing simple carbohydrates, increasing vegetables, increasing water intake,  decreasing eating out, no skipping meals, meal planning and cooking strategies, keeping healthy foods in the home, and planning for success.  Carol Walton has agreed to follow-up with our clinic in 2-3 weeks. She was informed of the importance of frequent follow-up visits to maximize her success with intensive lifestyle modifications for her multiple health conditions.   Objective:   Blood pressure 129/78, pulse 76, temperature 98.5 F (36.9 C), height 5\' 6"  (1.676 m), weight 257 lb (116.6 kg), SpO2 99 %. Body mass index is 41.48 kg/m.  General: Cooperative, alert, well developed, in no acute distress. HEENT: Conjunctivae and lids unremarkable. Cardiovascular: Regular rhythm.  Lungs: Normal work of breathing. Neurologic: No focal deficits.   Lab Results  Component Value Date   CREATININE 0.63 04/20/2022   BUN 9 04/20/2022   NA 137 04/20/2022   K 4.7 04/20/2022   CL 101 04/20/2022   CO2 26 04/20/2022   Lab Results  Component Value Date   ALT 30 04/20/2022   AST 27 04/20/2022   ALKPHOS 65 04/20/2022   BILITOT 0.3 04/20/2022   Lab Results  Component Value Date   HGBA1C 5.7 04/20/2022   Lab Results  Component Value Date   INSULIN 29.1 (H) 05/12/2022   Lab Results  Component Value Date   TSH 1.59 04/20/2022   Lab Results  Component Value Date   CHOL 251 (H) 04/20/2022   HDL 50.80 04/20/2022   LDLCALC 181 (H) 04/20/2022   TRIG 98.0 04/20/2022   CHOLHDL 5 04/20/2022   Lab Results  Component Value Date   VD25OH 55.1 05/12/2022   Lab Results  Component Value Date  WBC 9.6 04/20/2022   HGB 13.4 04/20/2022   HCT 42.5 05/12/2022   MCV 80.1 04/20/2022   PLT 277.0 04/20/2022   Lab Results  Component Value Date   IRON 25 (L) 05/12/2022   TIBC 332 05/12/2022   FERRITIN 27 05/12/2022     Attestation Statements:   Reviewed by clinician on day of visit: allergies, medications, problem list, medical history, surgical history, family history, social history, and  previous encounter notes.  I, Dawn Whitmire, FNP-C, am acting as transcriptionist for Dr. Jearld Lesch.  I have reviewed the above documentation for accuracy and completeness, and I agree with the above. Jearld Lesch, DO

## 2022-06-22 NOTE — Telephone Encounter (Signed)
Dr. Owens Shark - Prior authorization approved for [id number]. Effective: 06/18/2022 - 01/17/2023. Patient sent approval message via mychart.

## 2022-06-24 ENCOUNTER — Telehealth: Payer: Self-pay

## 2022-06-24 NOTE — Telephone Encounter (Signed)
Left Message for patient to return call  

## 2022-06-24 NOTE — Telephone Encounter (Signed)
Patient called and stated the pharmacy told her that the [facility] that was prescribed is on national back order. Patient would like to see if there is anything else that could be sent in for the patient.

## 2022-06-25 ENCOUNTER — Encounter: Payer: Self-pay | Admitting: Bariatrics

## 2022-06-25 NOTE — Telephone Encounter (Signed)
Patient stated she called different pharmacies. Notified patient she can discuss different medications at her next visit with Dr.Brown. Patient verbalized understanding.

## 2022-06-25 NOTE — Telephone Encounter (Signed)
Spoke to patient via phone

## 2022-07-02 ENCOUNTER — Ambulatory Visit: Payer: BC Managed Care – PPO | Admitting: Bariatrics

## 2022-07-02 ENCOUNTER — Other Ambulatory Visit (HOSPITAL_COMMUNITY): Payer: Self-pay

## 2022-07-02 ENCOUNTER — Encounter: Payer: Self-pay | Admitting: Bariatrics

## 2022-07-02 VITALS — BP 111/70 | HR 76 | Temp 98.1°F | Ht 66.0 in | Wt 256.0 lb

## 2022-07-02 DIAGNOSIS — E669 Obesity, unspecified: Secondary | ICD-10-CM | POA: Diagnosis not present

## 2022-07-02 DIAGNOSIS — Z6841 Body Mass Index (BMI) 40.0 and over, adult: Secondary | ICD-10-CM | POA: Diagnosis not present

## 2022-07-02 DIAGNOSIS — R7303 Prediabetes: Secondary | ICD-10-CM | POA: Diagnosis not present

## 2022-07-02 DIAGNOSIS — D508 Other iron deficiency anemias: Secondary | ICD-10-CM | POA: Diagnosis not present

## 2022-07-02 DIAGNOSIS — E66813 Obesity, class 3: Secondary | ICD-10-CM

## 2022-07-02 MED ORDER — POLYSACCHARIDE IRON COMPLEX 150 MG PO CAPS
150.0000 mg | ORAL_CAPSULE | Freq: Every day | ORAL | 0 refills | Status: DC
  Filled 2022-07-02: qty 60, 60d supply, fill #0

## 2022-07-02 MED ORDER — INSULIN PEN NEEDLE 31G X 5 MM MISC
0 refills | Status: DC
  Filled 2022-07-02: qty 100, fill #0
  Filled 2022-07-07: qty 100, 90d supply, fill #0

## 2022-07-02 MED ORDER — SAXENDA 18 MG/3ML ~~LOC~~ SOPN
0.6000 mg | PEN_INJECTOR | Freq: Every day | SUBCUTANEOUS | 0 refills | Status: DC
  Filled 2022-07-02: qty 3, 30d supply, fill #0
  Filled 2022-07-07: qty 6, 60d supply, fill #0

## 2022-07-07 ENCOUNTER — Encounter: Payer: Self-pay | Admitting: Bariatrics

## 2022-07-07 ENCOUNTER — Telehealth (INDEPENDENT_AMBULATORY_CARE_PROVIDER_SITE_OTHER): Payer: Self-pay | Admitting: Bariatrics

## 2022-07-07 ENCOUNTER — Encounter (INDEPENDENT_AMBULATORY_CARE_PROVIDER_SITE_OTHER): Payer: Self-pay

## 2022-07-07 ENCOUNTER — Other Ambulatory Visit (HOSPITAL_COMMUNITY): Payer: Self-pay

## 2022-07-07 NOTE — Progress Notes (Signed)
Chief Complaint:   OBESITY Carol Walton is here to discuss her progress with her obesity treatment plan along with follow-up of her obesity related diagnoses. Axel is on keeping a food journal and adhering to recommended goals of 1500-1600 calories and 90-100 grams of protein daily and states she is following her eating plan approximately 50-60% of the time. Angelisa states she is walking on the treadmill for 2 miles 5 times per week.    Today's visit was #: 4 Starting weight: 257 lbs Starting date: 05/12/2022 Today's weight: 256 lbs Today's date: 07/02/2022 Total lbs lost to date: 1 Total lbs lost since last in-office visit: 1  Interim History: Carol Walton is down 1 pound since her last visit.  Subjective:   1. Prediabetes [name] was placed on Wegovy, but she was unable to get her medication.  She wants Mounjaro but she is not eligible.  2. Other iron deficiency anemia [name] was placed on iron at her last visit, and she has not gotten her medication.  Her last iron saturation was low.  Assessment/Plan:   1. Prediabetes [name] agreed to start Saxenda 0.6 mg once daily with no refills, and pen needles #50 with no refills.  We will minimize all carbohydrates (starches and sweets).  GLP-1 sheet was given.  - Liraglutide -Weight Management (SAXENDA) 18 MG/3ML SOPN; Inject 0.6 mg into the skin daily.  Dispense: 6 mL; Refill: 0 - Insulin Pen Needle 31G X 5 MM MISC; Will use daily with Saxenda  Dispense: 50 each; Refill: 0  2. Other iron deficiency anemia We will reorder Nu-Iron 150 mg once daily with a 65-month supply, with no refills.  - iron polysaccharides (NU-IRON) 150 MG capsule; Take 1 capsule (150 mg total) by mouth daily.  Dispense: 60 capsule; Refill: 0  3. Obesity, Current BMI 41.4 Carol Walton is currently in the action stage of change. As such, her goal is to continue with weight loss efforts. She has agreed to keeping a food journal and adhering to recommended goals of 1500-1600  calories and 90 grams of protein daily.   Meal planning was discussed.  She will adhere to the plan 80-90%.  She is to decrease carbohydrates 50 to 60%.  Protein shakes.  Exercise goals: As is.   Behavioral modification strategies: increasing lean protein intake, decreasing simple carbohydrates, increasing vegetables, increasing water intake, decreasing eating out, no skipping meals, meal planning and cooking strategies, keeping healthy foods in the home, and planning for success.  [name] has agreed to follow-up with our clinic in 2 to 3 weeks. She was informed of the importance of frequent follow-up visits to maximize her success with intensive lifestyle modifications for her multiple health conditions.   Objective:   Blood pressure 111/70, pulse 76, temperature 98.1 F (36.7 C), height 5\' 6"  (1.676 m), weight 256 lb (116.1 kg), SpO2 96 %. Body mass index is 41.32 kg/m.  General: Cooperative, alert, well developed, in no acute distress. HEENT: Conjunctivae and lids unremarkable. Cardiovascular: Regular rhythm.  Lungs: Normal work of breathing. Neurologic: No focal deficits.   Lab Results  Component Value Date   CREATININE 0.63 04/20/2022   BUN 9 04/20/2022   NA 137 04/20/2022   K 4.7 04/20/2022   CL 101 04/20/2022   CO2 26 04/20/2022   Lab Results  Component Value Date   ALT 30 04/20/2022   AST 27 04/20/2022   ALKPHOS 65 04/20/2022   BILITOT 0.3 04/20/2022   Lab Results  Component Value Date  HGBA1C 5.7 04/20/2022   Lab Results  Component Value Date   INSULIN 29.1 (H) 05/12/2022   Lab Results  Component Value Date   TSH 1.59 04/20/2022   Lab Results  Component Value Date   CHOL 251 (H) 04/20/2022   HDL 50.80 04/20/2022   LDLCALC 181 (H) 04/20/2022   TRIG 98.0 04/20/2022   CHOLHDL 5 04/20/2022   Lab Results  Component Value Date   VD25OH 55.1 05/12/2022   Lab Results  Component Value Date   WBC 9.6 04/20/2022   HGB 13.4 04/20/2022   HCT 42.5  05/12/2022   MCV 80.1 04/20/2022   PLT 277.0 04/20/2022   Lab Results  Component Value Date   IRON 25 (L) 05/12/2022   TIBC 332 05/12/2022   FERRITIN 27 05/12/2022   Attestation Statements:   Reviewed by clinician on day of visit: allergies, medications, problem list, medical history, surgical history, family history, social history, and previous encounter notes.   Wilhemena Durie, am acting as Location manager for [employer], DO.  I have reviewed the above documentation for accuracy and completeness, and I agree with the above. Jearld Lesch, DO

## 2022-07-07 NOTE — Telephone Encounter (Signed)
Dr. Owens Shark - Prior authorization approved for Saxenda. Effective: 07/06/2022 - 11/06/2022. Patient sent approval message via mychart.

## 2022-07-17 ENCOUNTER — Encounter: Payer: Self-pay | Admitting: Bariatrics

## 2022-07-23 ENCOUNTER — Ambulatory Visit: Payer: BC Managed Care – PPO | Admitting: Nurse Practitioner

## 2022-07-23 ENCOUNTER — Encounter: Payer: Self-pay | Admitting: Nurse Practitioner

## 2022-07-23 ENCOUNTER — Encounter: Payer: BC Managed Care – PPO | Admitting: Nurse Practitioner

## 2022-07-28 ENCOUNTER — Encounter: Payer: Self-pay | Admitting: Nurse Practitioner

## 2022-07-28 ENCOUNTER — Telehealth (INDEPENDENT_AMBULATORY_CARE_PROVIDER_SITE_OTHER): Payer: BC Managed Care – PPO | Admitting: Nurse Practitioner

## 2022-07-28 VITALS — Ht 66.0 in | Wt 256.0 lb

## 2022-07-28 DIAGNOSIS — E669 Obesity, unspecified: Secondary | ICD-10-CM

## 2022-07-28 DIAGNOSIS — R7303 Prediabetes: Secondary | ICD-10-CM | POA: Diagnosis not present

## 2022-07-28 DIAGNOSIS — D508 Other iron deficiency anemias: Secondary | ICD-10-CM | POA: Diagnosis not present

## 2022-07-28 DIAGNOSIS — Z6841 Body Mass Index (BMI) 40.0 and over, adult: Secondary | ICD-10-CM

## 2022-07-29 ENCOUNTER — Other Ambulatory Visit (HOSPITAL_COMMUNITY): Payer: Self-pay

## 2022-07-29 MED ORDER — SAXENDA 18 MG/3ML ~~LOC~~ SOPN
3.0000 mg | PEN_INJECTOR | Freq: Every day | SUBCUTANEOUS | 0 refills | Status: DC
  Filled 2022-07-29: qty 15, 30d supply, fill #0

## 2022-08-03 NOTE — Progress Notes (Signed)
TeleHealth Visit:  Due to the COVID-19 pandemic, this visit was completed with telemedicine (audio/video) technology to reduce patient and provider exposure as well as to preserve personal protective equipment.   Carol Walton has verbally consented to this TeleHealth visit. The patient is located at home, the provider is located at the [facility] office. The participants in this visit include the listed provider and patient. The visit was conducted today via MyChart Video.   Chief Complaint: Carol Walton is here to discuss her progress with her obesity treatment plan along with follow-up of her obesity related diagnoses. Carol Walton is on the Category 2 Plan +1700-1800 calories and states she is following her eating plan approximately 65% of the time. Carol Walton states she is walking/yoga 30-40 minutes 5-7 times per week.  Today's visit was #: 5 Starting weight: 257 lbs Starting date: 05/12/2022  Interim History: Carol Walton is taking Saxenda 1.2 mg. She has been vomiting for the past 3 days. Reports a history of vomiting on and off for years. Feels worse since increasing to 1.2 mg. She was told that she needed to increase her dose by pharmacy. She is averaging around [id number] calories per day due to decreased appetite.   Subjective:   1. Other iron deficiency anemia Carol Walton is taking iron daily. Struggles with fatigue. Denies any side effects. Struggles with heavy menses and uses Nuvaring. With Nuvaring, menses is now 3 days. Prior to using, menses was 2-3 weeks.  2. Prediabetes Carol Walton's last A1c was 5.7, insulin was 29.1. She is taking Saxenda 1.2 mg.   Assessment/Plan:   1. Other iron deficiency anemia Continue taking iron daily.  Side effects discussed.    2. Prediabetes We will refill Saxenda 3 mg once daily for 1 month with 0 refills. Will decrease to 0.6 mg daily as patient did not vomit while on 0.6 mg. Side effects discussed. Stay on this dose until next visit.  -Refill  Liraglutide -Weight Management (SAXENDA) 18 MG/3ML SOPN; Inject 3 mg into the skin daily.  Dispense: 15 mL; Refill: 0  3. Obesity, Current BMI 41.4 Carol Walton is currently in the action stage of change. As such, her goal is to continue with weight loss efforts. She has agreed to keeping a food journal and adhering to recommended goals of 1500-1600 calories and 90-100 grams of protein.   Exercise goals: As is.  Behavioral modification strategies: increasing lean protein intake, increasing vegetables, increasing water intake, no skipping meals, and keeping a strict food journal.  Carol Walton has agreed to follow-up with our clinic in 3 weeks. She was informed of the importance of frequent follow-up visits to maximize her success with intensive lifestyle modifications for her multiple health conditions.  Objective:   VITALS: Per patient if applicable, see vitals. GENERAL: Alert and in no acute distress. CARDIOPULMONARY: No increased WOB. Speaking in clear sentences.  PSYCH: Pleasant and cooperative. Speech normal rate and rhythm. Affect is appropriate. Insight and judgement are appropriate. Attention is focused, linear, and appropriate.  NEURO: Oriented as arrived to appointment on time with no prompting.   Lab Results  Component Value Date   CREATININE 0.63 04/20/2022   BUN 9 04/20/2022   NA 137 04/20/2022   K 4.7 04/20/2022   CL 101 04/20/2022   CO2 26 04/20/2022   Lab Results  Component Value Date   ALT 30 04/20/2022   AST 27 04/20/2022   ALKPHOS 65 04/20/2022   BILITOT 0.3 04/20/2022   Lab Results  Component Value Date  HGBA1C 5.7 04/20/2022   Lab Results  Component Value Date   INSULIN 29.1 (H) 05/12/2022   Lab Results  Component Value Date   TSH 1.59 04/20/2022   Lab Results  Component Value Date   CHOL 251 (H) 04/20/2022   HDL 50.80 04/20/2022   LDLCALC 181 (H) 04/20/2022   TRIG 98.0 04/20/2022   CHOLHDL 5 04/20/2022   Lab Results  Component Value Date   VD25OH  55.1 05/12/2022   Lab Results  Component Value Date   WBC 9.6 04/20/2022   HGB 13.4 04/20/2022   HCT 42.5 05/12/2022   MCV 80.1 04/20/2022   PLT 277.0 04/20/2022   Lab Results  Component Value Date   IRON 25 (L) 05/12/2022   TIBC 332 05/12/2022   FERRITIN 27 05/12/2022    Attestation Statements:   Reviewed by clinician on day of visit: allergies, medications, problem list, medical history, surgical history, family history, social history, and previous encounter notes.  Time spent on visit including pre-visit chart review and post-visit charting and care was 30 minutes.   I, Brendell Tyus, RMA, am acting as transcriptionist for Everardo Pacific, FNP.  I have reviewed the above documentation for accuracy and completeness, and I agree with the above. Everardo Pacific, FNP

## 2022-08-12 ENCOUNTER — Other Ambulatory Visit (HOSPITAL_COMMUNITY): Payer: Self-pay

## 2022-08-12 ENCOUNTER — Other Ambulatory Visit: Payer: Self-pay

## 2022-08-12 ENCOUNTER — Telehealth (INDEPENDENT_AMBULATORY_CARE_PROVIDER_SITE_OTHER): Payer: Self-pay | Admitting: Nurse Practitioner

## 2022-08-12 DIAGNOSIS — R7303 Prediabetes: Secondary | ICD-10-CM

## 2022-08-12 MED ORDER — SAXENDA 18 MG/3ML ~~LOC~~ SOPN
3.0000 mg | PEN_INJECTOR | Freq: Every day | SUBCUTANEOUS | 0 refills | Status: DC
  Filled 2022-08-12 – 2022-08-13 (×2): qty 15, 30d supply, fill #0

## 2022-08-12 NOTE — Telephone Encounter (Signed)
Patient would like a call back from [facility] once she is available. Patient has questions concerning how many pens she will receive for a month's supply of the Saxenda. Patient has asked to be called back today at [phone]. Thank You!

## 2022-08-12 NOTE — Telephone Encounter (Signed)
I spoke with the pt to address her concerns below. She states that she received 2 pens with her last refill, and says that will not be enough to last her while she's in school. Pt was reassured that a 30 day supply, equivalent to 5 pens would be sent to [facility].  Pt expressed understanding and will pick up refill.

## 2022-08-13 ENCOUNTER — Other Ambulatory Visit: Payer: Self-pay | Admitting: Bariatrics

## 2022-08-13 ENCOUNTER — Other Ambulatory Visit (HOSPITAL_COMMUNITY): Payer: Self-pay

## 2022-08-13 DIAGNOSIS — R7303 Prediabetes: Secondary | ICD-10-CM

## 2022-08-15 ENCOUNTER — Other Ambulatory Visit (HOSPITAL_COMMUNITY): Payer: Self-pay

## 2022-08-17 ENCOUNTER — Encounter: Payer: Self-pay | Admitting: Bariatrics

## 2022-08-17 ENCOUNTER — Ambulatory Visit: Payer: BC Managed Care – PPO | Admitting: Bariatrics

## 2022-08-17 ENCOUNTER — Other Ambulatory Visit (HOSPITAL_COMMUNITY): Payer: Self-pay

## 2022-08-17 VITALS — BP 132/74 | HR 79 | Temp 99.1°F | Ht 66.0 in | Wt 246.0 lb

## 2022-08-17 DIAGNOSIS — F330 Major depressive disorder, recurrent, mild: Secondary | ICD-10-CM | POA: Diagnosis not present

## 2022-08-17 DIAGNOSIS — E7849 Other hyperlipidemia: Secondary | ICD-10-CM

## 2022-08-17 DIAGNOSIS — R7303 Prediabetes: Secondary | ICD-10-CM

## 2022-08-17 DIAGNOSIS — F419 Anxiety disorder, unspecified: Secondary | ICD-10-CM | POA: Diagnosis not present

## 2022-08-17 DIAGNOSIS — E669 Obesity, unspecified: Secondary | ICD-10-CM

## 2022-08-17 DIAGNOSIS — Z6839 Body mass index (BMI) 39.0-39.9, adult: Secondary | ICD-10-CM

## 2022-08-17 MED ORDER — INSULIN PEN NEEDLE 31G X 5 MM MISC
0 refills | Status: DC
  Filled 2022-08-17: qty 100, fill #0
  Filled 2022-08-18: qty 100, 100d supply, fill #0
  Filled 2022-11-04: qty 100, 90d supply, fill #0

## 2022-08-17 MED ORDER — VENLAFAXINE HCL ER 37.5 MG PO CP24
37.5000 mg | ORAL_CAPSULE | Freq: Every day | ORAL | 0 refills | Status: DC
  Filled 2022-08-17: qty 90, 90d supply, fill #0

## 2022-08-18 ENCOUNTER — Other Ambulatory Visit (HOSPITAL_COMMUNITY): Payer: Self-pay

## 2022-08-18 ENCOUNTER — Ambulatory Visit: Payer: BC Managed Care – PPO | Admitting: Bariatrics

## 2022-08-27 NOTE — Progress Notes (Signed)
Chief Complaint:   OBESITY Carol Walton is here to discuss her progress with her obesity treatment plan along with follow-up of her obesity related diagnoses. [name] is on keeping a food journal and adhering to recommended goals of 1500-1700 calories and 90-100 gms protein daily and states she is following her eating plan approximately 100% of the time. [name] states she going to the gym and walking for 60 minutes 4-5 times per week.  Today's visit was #: 6 Starting weight: 257 lbs Starting date: 05/12/2022 Today's weight: 246 lbs Today's date: 08/17/22 Total lbs lost to date: 11 Total lbs lost since last in-office visit: 10  Interim History: She is down 10 pounds since her last visit.  Subjective:   1. Prediabetes Taking Saxenda.  Getting in her protein.  2. Other hyperlipidemia No medication, but changing diet.   3. Depression, major, recurrent, mild (HCC) Taking medications as prescribed.   4. Anxiety disorder, unspecified type Taking medications as prescribed.   Assessment/Plan:   1. Prediabetes Increase Saxenda dose from 0.6 mg to 1.2 mg daily. - Insulin Pen Needle 31G X 5 MM MISC; Will use daily with Saxenda  Dispense: 100 each; Refill: 0  2. Other hyperlipidemia 1.  Continue diet and exercise.  3. Depression, major, recurrent, mild (HCC) Will get subsequent prescriptions through her PCP. - venlafaxine XR (EFFEXOR-XR) 37.5 MG 24 hr capsule; Take 1 capsule (37.5 mg total) by mouth daily with breakfast.  Dispense: 90 capsule; Refill: 0  4. Anxiety disorder, unspecified type Will get subsequent prescriptions through her PCP. - venlafaxine XR (EFFEXOR-XR) 37.5 MG 24 hr capsule; Take 1 capsule (37.5 mg total) by mouth daily with breakfast.  Dispense: 90 capsule; Refill: 0  5. Obesity, Current BMI 39.7 1.  Meal planning. 2.  We will continue to adhere closely to the plan.  Carol Walton is currently in the action stage of change. As such, her goal is to continue with  weight loss efforts. She has agreed to keeping a food journal and adhering to recommended goals of 1500-1700 calories and 90-100 gms protein dail.   Exercise goals:  as is  Behavioral modification strategies: increasing lean protein intake, decreasing simple carbohydrates, increasing vegetables, increasing water intake, decreasing eating out, no skipping meals, meal planning and cooking strategies, keeping healthy foods in the home, and planning for success.  Carol Walton has agreed to follow-up with our clinic in 3-4 weeks, fasting. She was informed of the importance of frequent follow-up visits to maximize her success with intensive lifestyle modifications for her multiple health conditions.   Objective:   Blood pressure 132/74, pulse 79, temperature 99.1 F (37.3 C), height 5\' 6"  (1.676 m), weight 246 lb (111.6 kg), SpO2 99 %. Body mass index is 39.71 kg/m.  General: Cooperative, alert, well developed, in no acute distress. HEENT: Conjunctivae and lids unremarkable. Cardiovascular: Regular rhythm.  Lungs: Normal work of breathing. Neurologic: No focal deficits.   Lab Results  Component Value Date   CREATININE 0.63 04/20/2022   BUN 9 04/20/2022   NA 137 04/20/2022   K 4.7 04/20/2022   CL 101 04/20/2022   CO2 26 04/20/2022   Lab Results  Component Value Date   ALT 30 04/20/2022   AST 27 04/20/2022   ALKPHOS 65 04/20/2022   BILITOT 0.3 04/20/2022   Lab Results  Component Value Date   HGBA1C 5.7 04/20/2022   Lab Results  Component Value Date   INSULIN 29.1 (H) 05/12/2022   Lab Results  Component Value  Date   TSH 1.59 04/20/2022   Lab Results  Component Value Date   CHOL 251 (H) 04/20/2022   HDL 50.80 04/20/2022   LDLCALC 181 (H) 04/20/2022   TRIG 98.0 04/20/2022   CHOLHDL 5 04/20/2022   Lab Results  Component Value Date   VD25OH 55.1 05/12/2022   Lab Results  Component Value Date   WBC 9.6 04/20/2022   HGB 13.4 04/20/2022   HCT 42.5 05/12/2022   MCV 80.1  04/20/2022   PLT 277.0 04/20/2022   Lab Results  Component Value Date   IRON 25 (L) 05/12/2022   TIBC 332 05/12/2022   FERRITIN 27 05/12/2022    Attestation Statements:   Reviewed by clinician on day of visit: allergies, medications, problem list, medical history, surgical history, family history, social history, and previous encounter notes.  I, Dawn Whitmire, FNP-C, am acting as transcriptionist for Dr. Corinna Capra.  I have reviewed the above documentation for accuracy and completeness, and I agree with the above. Corinna Capra, DO

## 2022-08-31 ENCOUNTER — Encounter: Payer: Self-pay | Admitting: Bariatrics

## 2022-09-07 ENCOUNTER — Ambulatory Visit: Payer: BC Managed Care – PPO | Admitting: Bariatrics

## 2022-09-07 ENCOUNTER — Other Ambulatory Visit (HOSPITAL_COMMUNITY): Payer: Self-pay

## 2022-09-07 ENCOUNTER — Encounter: Payer: Self-pay | Admitting: Bariatrics

## 2022-09-07 VITALS — BP 113/75 | HR 87 | Temp 98.4°F | Ht 66.0 in | Wt 246.0 lb

## 2022-09-07 DIAGNOSIS — E7849 Other hyperlipidemia: Secondary | ICD-10-CM

## 2022-09-07 DIAGNOSIS — E559 Vitamin D deficiency, unspecified: Secondary | ICD-10-CM

## 2022-09-07 DIAGNOSIS — R7303 Prediabetes: Secondary | ICD-10-CM

## 2022-09-07 DIAGNOSIS — Z6839 Body mass index (BMI) 39.0-39.9, adult: Secondary | ICD-10-CM

## 2022-09-07 DIAGNOSIS — D508 Other iron deficiency anemias: Secondary | ICD-10-CM | POA: Diagnosis not present

## 2022-09-07 DIAGNOSIS — E669 Obesity, unspecified: Secondary | ICD-10-CM

## 2022-09-07 MED ORDER — SAXENDA 18 MG/3ML ~~LOC~~ SOPN
3.0000 mg | PEN_INJECTOR | Freq: Every day | SUBCUTANEOUS | 0 refills | Status: DC
  Filled 2022-09-07 – 2022-09-24 (×2): qty 15, 30d supply, fill #0

## 2022-09-08 LAB — COMPREHENSIVE METABOLIC PANEL
ALT: 19 IU/L (ref 0–32)
AST: 13 IU/L (ref 0–40)
Albumin/Globulin Ratio: 1.7 (ref 1.2–2.2)
Albumin: 4.1 g/dL (ref 4.0–5.0)
Alkaline Phosphatase: 70 IU/L (ref 42–106)
BUN/Creatinine Ratio: 15 (ref 9–23)
BUN: 11 mg/dL (ref 6–20)
Bilirubin Total: <0.2 mg/dL (ref 0.0–1.2)
CO2: 22 mmol/L (ref 20–29)
Calcium: 9.3 mg/dL (ref 8.7–10.2)
Chloride: 104 mmol/L (ref 96–106)
Creatinine, Ser: 0.75 mg/dL (ref 0.57–1.00)
Globulin, Total: 2.4 g/dL (ref 1.5–4.5)
Glucose: 95 mg/dL (ref 70–99)
Potassium: 3.8 mmol/L (ref 3.5–5.2)
Sodium: 139 mmol/L (ref 134–144)
Total Protein: 6.5 g/dL (ref 6.0–8.5)
eGFR: 118 mL/min/{1.73_m2} (ref >59)

## 2022-09-08 LAB — ANEMIA PANEL
Ferritin: 40 ng/mL (ref 15–77)
Folate, Hemolysate: 350 ng/mL
Folate, RBC: 868 ng/mL (ref >498)
Hematocrit: 40.3 % (ref 34.0–46.6)
Iron Saturation: 13 % — ABNORMAL LOW (ref 15–55)
Iron: 41 ug/dL (ref 27–159)
Retic Ct Pct: 1 % (ref 0.6–2.6)
Total Iron Binding Capacity: 308 ug/dL (ref 250–450)
UIBC: 267 ug/dL (ref 131–425)
Vitamin B-12: 365 pg/mL (ref 232–1245)

## 2022-09-08 LAB — LIPID PANEL WITH LDL/HDL RATIO
Cholesterol, Total: 194 mg/dL — ABNORMAL HIGH (ref 100–169)
HDL: 38 mg/dL — ABNORMAL LOW (ref >39)
LDL Chol Calc (NIH): 125 mg/dL — ABNORMAL HIGH (ref 0–109)
LDL/HDL Ratio: 3.3 ratio — ABNORMAL HIGH (ref 0.0–3.2)
Triglycerides: 172 mg/dL — ABNORMAL HIGH (ref 0–89)
VLDL Cholesterol Cal: 31 mg/dL (ref 5–40)

## 2022-09-08 LAB — INSULIN, RANDOM: INSULIN: 38.3 u[IU]/mL — ABNORMAL HIGH (ref 2.6–24.9)

## 2022-09-08 LAB — VITAMIN D 25 HYDROXY (VIT D DEFICIENCY, FRACTURES): Vit D, 25-Hydroxy: 43.2 ng/mL (ref 30.0–100.0)

## 2022-09-08 LAB — HEMOGLOBIN A1C
Est. average glucose Bld gHb Est-mCnc: 105 mg/dL
Hgb A1c MFr Bld: 5.3 % (ref 4.8–5.6)

## 2022-09-09 ENCOUNTER — Telehealth: Payer: Self-pay

## 2022-09-09 NOTE — Telephone Encounter (Signed)
-----   Message from [place] Nickel, DO sent at 09/09/2022  9:25 AM EST ----- Call pt. Her iron is improving slowly. She should just continue her iron at this time. We will discuss her other labs at the next visit.

## 2022-09-09 NOTE — Telephone Encounter (Signed)
Notified patient of lab results per Dr. Brown. Patient verbalized understanding.  

## 2022-09-10 ENCOUNTER — Other Ambulatory Visit: Payer: Self-pay | Admitting: Bariatrics

## 2022-09-10 DIAGNOSIS — D508 Other iron deficiency anemias: Secondary | ICD-10-CM

## 2022-09-14 ENCOUNTER — Other Ambulatory Visit (HOSPITAL_COMMUNITY): Payer: Self-pay

## 2022-09-14 ENCOUNTER — Other Ambulatory Visit (INDEPENDENT_AMBULATORY_CARE_PROVIDER_SITE_OTHER): Payer: Self-pay | Admitting: Bariatrics

## 2022-09-14 DIAGNOSIS — D508 Other iron deficiency anemias: Secondary | ICD-10-CM

## 2022-09-14 MED ORDER — POLYSACCHARIDE IRON COMPLEX 150 MG PO CAPS
150.0000 mg | ORAL_CAPSULE | Freq: Every day | ORAL | 0 refills | Status: DC
  Filled 2022-09-14 – 2022-09-24 (×2): qty 60, 60d supply, fill #0

## 2022-09-17 ENCOUNTER — Other Ambulatory Visit: Payer: Self-pay | Admitting: Family Medicine

## 2022-09-17 ENCOUNTER — Telehealth: Payer: Self-pay

## 2022-09-17 NOTE — Telephone Encounter (Signed)
Patient called in and states she would like to talk to Dr. Manson Passey.

## 2022-09-17 NOTE — Telephone Encounter (Signed)
Dr. Manson Passey can you please call [name]?

## 2022-09-18 ENCOUNTER — Encounter: Payer: Self-pay | Admitting: Family Medicine

## 2022-09-20 ENCOUNTER — Other Ambulatory Visit: Payer: Self-pay | Admitting: Family Medicine

## 2022-09-22 ENCOUNTER — Encounter: Payer: Self-pay | Admitting: Bariatrics

## 2022-09-22 ENCOUNTER — Other Ambulatory Visit (HOSPITAL_COMMUNITY): Payer: Self-pay

## 2022-09-22 NOTE — Progress Notes (Signed)
Chief Complaint:   OBESITY Carol Walton is here to discuss her progress with her obesity treatment plan along with follow-up of her obesity related diagnoses. Carol Walton is on keeping a food journal and adhering to recommended goals of 1500-1700 calories and 90-100 grams of protein and states she is following her eating plan approximately 100% of the time. Carol Walton states she is at the gym for 30-60 minutes 3-5 times per week.  Today's visit was #: 7 Starting weight: 257 lbs Starting date: 05/12/2022 Today's weight: 246 lbs Today's date: 09/07/2022 Total lbs lost to date: 11 Total lbs lost since last in-office visit: 0  Interim History: [name]'s weight remains the same.  This has been over the holiday.   Subjective:   1. Prediabetes [name] is taking Saxenda with no side effects.  2. Other hyperlipidemia [name] is not on medications currently.  3. Other iron deficiency anemia Carol Walton is taking iron supplementation.  Last iron saturation was low on 05/13/2022.  4. Vitamin D deficiency Carol Walton is taking prenatal vitamins.  Assessment/Plan:   1. Prediabetes We will check labs today, and we will refill Saxenda 3 mg once daily for 1 month.  - Liraglutide -Weight Management (SAXENDA) 18 MG/3ML SOPN; Inject 3 mg into the skin daily.  Dispense: 15 mL; Refill: 0 - Anemia panel - Comprehensive metabolic panel - Insulin, random - Hemoglobin A1c  2. Other hyperlipidemia We will check labs today.  Kinsie will continue working on eliminating trans fats and decrease unhealthy saturated fats.  - Comprehensive metabolic panel - Lipid Panel With LDL/HDL Ratio  3. Other iron deficiency anemia We will check labs today, and we will follow-up at [facility] next visit.  4. Vitamin D deficiency We will check labs today, and we will follow-up at [facility] next visit.  - VITAMIN D 25 Hydroxy (Vit-D Deficiency, Fractures)  5. Obesity, Current BMI 39.8 Carol Walton is currently in the action stage of  change. As such, her goal is to continue with weight loss efforts. She has agreed to the Category 3 Plan and keeping a food journal and adhering to recommended goals of 1500-1700 calories and 90-100 grams of protein.   Continue to follow the plan 100%.  Exercise goals: As is. She will consider options for exercise (on-line walking).   Behavioral modification strategies: increasing lean protein intake, decreasing simple carbohydrates, increasing vegetables, increasing water intake, decreasing eating out, no skipping meals, meal planning and cooking strategies, keeping healthy foods in the home, and planning for success.  Carol Walton has agreed to follow-up with our clinic in 3 to 4 weeks. She was informed of the importance of frequent follow-up visits to maximize her success with intensive lifestyle modifications for her multiple health conditions.   Carol Walton was informed we would discuss her lab results at her next visit unless there is a critical issue that needs to be addressed sooner. Carol Walton agreed to keep her next visit at the agreed upon time to discuss these results.  Objective:   Blood pressure 113/75, pulse 87, temperature 98.4 F (36.9 C), height 5\' 6"  (1.676 m), weight 246 lb (111.6 kg), SpO2 97 %. Body mass index is 39.71 kg/m.  General: Cooperative, alert, well developed, in no acute distress. HEENT: Conjunctivae and lids unremarkable. Cardiovascular: Regular rhythm.  Lungs: Normal work of breathing. Neurologic: No focal deficits.   Lab Results  Component Value Date   CREATININE 0.75 09/07/2022   BUN 11 09/07/2022   NA 139 09/07/2022   K 3.8 09/07/2022   CL  104 09/07/2022   CO2 22 09/07/2022   Lab Results  Component Value Date   ALT 19 09/07/2022   AST 13 09/07/2022   ALKPHOS 70 09/07/2022   BILITOT <0.2 09/07/2022   Lab Results  Component Value Date   HGBA1C 5.3 09/07/2022   HGBA1C 5.7 04/20/2022   Lab Results  Component Value Date   INSULIN 38.3 (H) 09/07/2022    INSULIN 29.1 (H) 05/12/2022   Lab Results  Component Value Date   TSH 1.59 04/20/2022   Lab Results  Component Value Date   CHOL 194 (H) 09/07/2022   HDL 38 (L) 09/07/2022   LDLCALC 125 (H) 09/07/2022   TRIG 172 (H) 09/07/2022   CHOLHDL 5 04/20/2022   Lab Results  Component Value Date   VD25OH 43.2 09/07/2022   VD25OH 55.1 05/12/2022   Lab Results  Component Value Date   WBC 9.6 04/20/2022   HGB 13.4 04/20/2022   HCT 40.3 09/07/2022   MCV 80.1 04/20/2022   PLT 277.0 04/20/2022   Lab Results  Component Value Date   IRON 41 09/07/2022   TIBC 308 09/07/2022   FERRITIN 40 09/07/2022   Attestation Statements:   Reviewed by clinician on day of visit: allergies, medications, problem list, medical history, surgical history, family history, social history, and previous encounter notes.   Trude Mcburney, am acting as Energy manager for [employer], DO.  I have reviewed the above documentation for accuracy and completeness, and I agree with the above. Corinna Capra, DO

## 2022-09-23 ENCOUNTER — Encounter: Payer: Self-pay | Admitting: Family Medicine

## 2022-09-24 ENCOUNTER — Other Ambulatory Visit (HOSPITAL_COMMUNITY): Payer: Self-pay

## 2022-09-25 NOTE — Progress Notes (Signed)
ACUTE VISIT Chief Complaint  Patient presents with   Form Completion   HPI: Carol Walton is a 21 y.o. female with PMHx significant for anxiety,depression,vit D def,and BMI 40 here today to have school form completed and vaccinations updated. 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 Most of required vaccines are up to date. She has completed meningitis vaccine, she needed to have them before starting college, we do not have documentation of 2nd dose but it is not needed to start nursing school. No risk factors for TB but she still needs to be screened.  She does not have a history of recent hospitalization, seizures, color blindness, or alcohol use. She does not smoke.   Anxiety and wt have been managed by Dr Manson Passey at the [facility]. She is on Effexor XR 37.5 mg daily.     09/29/2022   12:35 PM 05/12/2022    9:18 AM 04/20/2022    1:59 PM 08/10/2017    5:25 PM 12/10/2016    4:05 PM  Depression screen PHQ 2/9  Decreased Interest 0 2 1 2 2   Down, Depressed, Hopeless 0 2 0 2 2  PHQ - 2 Score 0 4 1 4 4   Altered sleeping 0 1 2 3 1   Tired, decreased energy 1 3 1 3  0  Change in appetite 0 1 1 2  0  Feeling bad or failure about yourself  1 2 0 3 2  Trouble concentrating 0 0 0 2 2  Moving slowly or fidgety/restless 0 0 1 3 2   Suicidal thoughts 0 0 0 0 0  PHQ-9 Score 2 11 6 20 11   Difficult doing work/chores Not difficult at all Very difficult Somewhat difficult Somewhat difficult Somewhat difficult   She follows with gynecologist.  Review of Systems  Constitutional:  Negative for activity change, appetite change and fever.  HENT:  Negative for mouth sores and nosebleeds.   Eyes:  Negative for redness and visual disturbance.  Respiratory:  Negative for cough, shortness of breath and wheezing.   Cardiovascular:  Negative for chest pain, palpitations and leg swelling.  Gastrointestinal:  Negative for abdominal pain, nausea and  vomiting.       Negative for changes in bowel habits.  Genitourinary:  Negative for decreased urine volume and hematuria.  Neurological:  Negative for syncope, weakness and headaches.  Psychiatric/Behavioral:  Negative for confusion and hallucinations.   See other pertinent positives and negatives in HPI.  Current Outpatient Medications on File Prior to Visit  Medication Sig Dispense Refill   albuterol (VENTOLIN HFA) 108 (90 Base) MCG/ACT inhaler Inhale 2 puffs into the lungs every 6 (six) hours as needed for wheezing or shortness of breath. 8 g 1   Insulin Pen Needle 31G X 5 MM MISC Will use daily with Saxenda 100 each 0   iron polysaccharides (NU-IRON) 150 MG capsule Take 1 capsule (150 mg total) by mouth daily. 60 capsule 0   Liraglutide -Weight Management (SAXENDA) 18 MG/3ML SOPN Inject 3 mg into the skin daily. 15 mL 0   NUVARING 0.12-0.015 MG/24HR vaginal ring Place vaginally.     venlafaxine XR (EFFEXOR-XR) 37.5 MG 24 hr capsule Take 1 capsule (37.5 mg total) by mouth daily with breakfast. 90 capsule 0   No current facility-administered medications on file prior to visit.   Past Medical History:  Diagnosis Date   Allergy    Anemia    Back pain  Depression    Eczema    IBS (irritable bowel syndrome)    Joint pain    SOB (shortness of breath)    No Known Allergies  Social History   Socioeconomic History   Marital status: Single    Spouse name: Not on file   Number of children: Not on file   Years of education: Not on file   Highest education level: Some college, no degree  Occupational History   Occupation: Consulting civil engineer At [employer]  Tobacco Use   Smoking status: Never   Smokeless tobacco: Never  Substance and Sexual Activity   Alcohol use: No   Drug use: No   Sexual activity: Never  Other Topics Concern   Not on file  Social History Narrative   Not on file   Social Determinants of Health   Financial Resource Strain: Low Risk  (04/20/2022)   Overall Financial  Resource Strain (CARDIA)    Difficulty of Paying Living Expenses: Not hard at all  Food Insecurity: No Food Insecurity (04/20/2022)   Hunger Vital Sign    Worried About Running Out of Food in the Last Year: Never true    Ran Out of Food in the Last Year: Never true  Transportation Needs: No Transportation Needs (04/20/2022)   PRAPARE - Administrator, Civil Service (Medical): No    Lack of Transportation (Non-Medical): No  Physical Activity: Sufficiently Active (04/20/2022)   Exercise Vital Sign    Days of Exercise per Week: 3 days    Minutes of Exercise per Session: 60 min  Stress: Stress Concern Present (04/20/2022)   [employer] of Occupational Health - Occupational Stress Questionnaire    Feeling of Stress : Rather much  Social Connections: Unknown (04/20/2022)   Social Connection and Isolation Panel [NHANES]    Frequency of Communication with Friends and Family: More than three times a week    Frequency of Social Gatherings with Friends and Family: Once a week    Attends Religious Services: Patient refused    Active Member of Clubs or Organizations: No    Attends Banker Meetings: Not on file    Marital Status: Patient refused   Vitals:   09/29/22 1217  BP: 120/70  Pulse: 100  Resp: 12  Temp: 99.2 F (37.3 C)  SpO2: 98%   Body mass index is 40.67 kg/m.  Physical Exam Vitals and nursing note reviewed.  Constitutional:      General: She is not in acute distress.    Appearance: She is well-developed.  HENT:     Head: Normocephalic and atraumatic.     Mouth/Throat:     Mouth: Mucous membranes are moist.     Pharynx: Oropharynx is clear.  Eyes:     Conjunctiva/sclera: Conjunctivae normal.  Cardiovascular:     Rate and Rhythm: Normal rate and regular rhythm.     Heart sounds: No murmur heard. Pulmonary:     Effort: Pulmonary effort is normal. No respiratory distress.     Breath sounds: Normal breath sounds.  Abdominal:     Palpations:  Abdomen is soft. There is no hepatomegaly or mass.     Tenderness: There is no abdominal tenderness.  Lymphadenopathy:     Cervical: No cervical adenopathy.  Skin:    General: Skin is warm.     Findings: No erythema or rash.  Neurological:     General: No focal deficit present.     Mental Status: She is alert and oriented to  person, place, and time.     Cranial Nerves: No cranial nerve deficit.     Gait: Gait normal.  Psychiatric:        Mood and Affect: Affect normal. Mood is anxious.   ASSESSMENT AND PLAN: Zarriah was seen today for form completion.  Diagnoses and all orders for this visit:  School health examination Form completed and signed. TB screening ordered and vaccines up dated.  Screening-pulmonary TB -     QuantiFERON-TB Gold Plus  Need for influenza vaccination -     Flu Vaccine QUAD 44mo+IM (Fluarix, Fluzone & Alfiuria Quad PF)  Depression, major, recurrent, mild (HCC) Well controlled. Currently on Effexor XR 37.5 mg daily. Following with Dr Manson Passey.  Class 3 severe obesity with serious comorbidity and body mass index (BMI) of 40.0 to 44.9 in adult, unspecified obesity type (HCC) Currently on Saxenda. Follows with [facility] clinic.  Jacqualin Shirkey G. [place], MD  [facility]. [place] office.

## 2022-09-26 ENCOUNTER — Encounter: Payer: Self-pay | Admitting: Bariatrics

## 2022-09-29 ENCOUNTER — Ambulatory Visit: Payer: BC Managed Care – PPO | Admitting: Bariatrics

## 2022-09-29 ENCOUNTER — Ambulatory Visit (INDEPENDENT_AMBULATORY_CARE_PROVIDER_SITE_OTHER): Payer: BC Managed Care – PPO | Admitting: Family Medicine

## 2022-09-29 ENCOUNTER — Encounter: Payer: Self-pay | Admitting: Family Medicine

## 2022-09-29 VITALS — BP 120/70 | HR 100 | Temp 99.2°F | Resp 12 | Ht 66.0 in | Wt 252.0 lb

## 2022-09-29 DIAGNOSIS — Z020 Encounter for examination for admission to educational institution: Secondary | ICD-10-CM | POA: Diagnosis not present

## 2022-09-29 DIAGNOSIS — Z6841 Body Mass Index (BMI) 40.0 and over, adult: Secondary | ICD-10-CM

## 2022-09-29 DIAGNOSIS — F330 Major depressive disorder, recurrent, mild: Secondary | ICD-10-CM | POA: Diagnosis not present

## 2022-09-29 DIAGNOSIS — Z23 Encounter for immunization: Secondary | ICD-10-CM | POA: Diagnosis not present

## 2022-09-29 DIAGNOSIS — Z111 Encounter for screening for respiratory tuberculosis: Secondary | ICD-10-CM

## 2022-09-29 NOTE — Telephone Encounter (Signed)
Patient's appt has been cancelled

## 2022-09-29 NOTE — Patient Instructions (Addendum)
A few things to remember from today's visit:  Screening-pulmonary TB - Plan: QuantiFERON-TB Gold Plus  School health examination  If you send a my chart message, it may take a few days to be addressed, specially if I am not in the office.  Please be sure medication list is accurate. If a new problem present, please set up appointment sooner than planned today.

## 2022-10-01 ENCOUNTER — Encounter: Payer: Self-pay | Admitting: Family Medicine

## 2022-10-01 ENCOUNTER — Ambulatory Visit: Payer: BC Managed Care – PPO | Admitting: Bariatrics

## 2022-10-01 LAB — QUANTIFERON-TB GOLD PLUS
Mitogen-NIL: >10 IU/mL
NIL: 0.01 IU/mL
QuantiFERON-TB Gold Plus: NEGATIVE
TB1-NIL: 0.01 IU/mL
TB2-NIL: 0.01 IU/mL

## 2022-10-01 NOTE — Telephone Encounter (Signed)
Pt called to make sure MD received this information, and that it was entered into her records.

## 2022-10-13 ENCOUNTER — Encounter: Payer: Self-pay | Admitting: Bariatrics

## 2022-10-14 NOTE — Telephone Encounter (Signed)
Can you please advise? Can you please advise 09/07/22 canceled on 10/01/22.

## 2022-11-02 NOTE — Telephone Encounter (Signed)
Scheduled appointment with [facility] for the patient. Scheduled appt with Dr. Owens Shark for March 4th in person.

## 2022-11-03 NOTE — Progress Notes (Unsigned)
TeleHealth Visit:  This visit was completed with telemedicine (audio/video) technology. Carol Walton has verbally consented to this TeleHealth visit. The patient is located at home, the provider is located at home. The participants in this visit include the listed provider and patient. The visit was conducted today via MyChart video.  Carol Walton is here to discuss her progress with her obesity treatment plan along with follow-up of her obesity related diagnoses.   Today's visit was # 8 Starting weight: 257 lbs Starting date: 05/12/2022 Weight at last in office visit: 246 lbs on 09/07/22 Total weight loss: 11 lbs at last in office visit on 09/07/22. Today's reported weight: No weight reported.  Nutrition Plan: Category 3 Plan and keeping a food journal and adhering to recommended goals of 1500-1700 calories and 90-100 grams of protein.  .   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  She is really struggling to get in enough calories or protein.  Averages about 1-1/2 meals per day.  Has not had time to exercise.  Reports having sleep deficit. She averages about 1000 cal a day and is not sure what her protein intake is. She is very frustrated with lack of weight loss.  Her weight has been the same since 08/17/2022.  She is happy she has not gained weight however.  Assessment/Plan:  We discussed recent lab results in depth. 1. Iron deficiency anemia Anemia is stable.  Iron saturation has improved.  Denies heavy periods. Iron supplementation: Nu-iron 150 mg by mouth daily Lab Results  Component Value Date   WBC 9.6 04/20/2022   HGB 13.4 04/20/2022   HCT 40.3 09/07/2022   MCV 80.1 04/20/2022   PLT 277.0 04/20/2022   Lab Results  Component Value Date   IRON 41 09/07/2022   TIBC 308 09/07/2022   FERRITIN 40 09/07/2022     Plan: Refill Nu-Iron 150 mg daily.  2. Prediabetes A1c has reduced from 5.7-5.3.  She is on  Saxenda 1.2 mg daily.  Appetite well-controlled.  Denies side effects.  Fasting insulin still high at 38.3.  Lab Results  Component Value Date   HGBA1C 5.3 09/07/2022   Lab Results  Component Value Date   INSULIN 38.3 (H) 09/07/2022   INSULIN 29.1 (H) 05/12/2022    Plan: Refill Saxenda 3 mg daily. Discussed that state health plan will stop covering this medicine April 1. Will refill  36-month supply at next visit.   3. Hyperlipidemia LDL still elevated but improved.  LDL was 181 and is now 125.  HDL low at 38.  Triglycerides high at 172.  She has strong family history of hyperlipidemia. Medication(s): none.   Lab Results  Component Value Date   CHOL 194 (H) 09/07/2022   HDL 38 (L) 09/07/2022   LDLCALC 125 (H) 09/07/2022   TRIG 172 (H) 09/07/2022   CHOLHDL 5 04/20/2022   Lab Results  Component Value Date   ALT 19 09/07/2022   AST 13 09/07/2022   ALKPHOS 70 09/07/2022   BILITOT <0.2 09/07/2022   The ASCVD Risk score (Arnett DK, et al., 2019) failed to calculate for the following reasons:   The 2019 ASCVD risk score is only valid for ages 2 to 75  Plan: Decrease saturated fats Increase aerobic exercise Increase fiber.    Obesity: Current BMI 39.7 Naida {CHL AMB IS/IS NOT:210130109} currently in the action stage of change. As such, her goal is to {MWMwtloss#1:210800005}.  She has agreed to {MWMwtlossportion/plan2:23431}.  Exercise goals: {MWM EXERCISE RECS:23473}  Behavioral modification strategies: {MWMwtlossdietstrategies3:23432}.  Carol Walton has agreed to follow-up with our clinic in {NUMBER 1-10:22536} weeks.   No orders of the defined types were placed in this encounter.   There are no discontinued medications.   No orders of the defined types were placed in this encounter.     Objective:   VITALS: Per patient if applicable, see vitals. GENERAL: Alert and in no acute distress. CARDIOPULMONARY: No increased WOB. Speaking in clear sentences.  PSYCH:  Pleasant and cooperative. Speech normal rate and rhythm. Affect is appropriate. Insight and judgement are appropriate. Attention is focused, linear, and appropriate.  NEURO: Oriented as arrived to appointment on time with no prompting.   Lab Results  Component Value Date   CREATININE 0.75 09/07/2022   BUN 11 09/07/2022   NA 139 09/07/2022   K 3.8 09/07/2022   CL 104 09/07/2022   CO2 22 09/07/2022   Lab Results  Component Value Date   ALT 19 09/07/2022   AST 13 09/07/2022   ALKPHOS 70 09/07/2022   BILITOT <0.2 09/07/2022   Lab Results  Component Value Date   HGBA1C 5.3 09/07/2022   HGBA1C 5.7 04/20/2022   Lab Results  Component Value Date   INSULIN 38.3 (H) 09/07/2022   INSULIN 29.1 (H) 05/12/2022   Lab Results  Component Value Date   TSH 1.59 04/20/2022   Lab Results  Component Value Date   CHOL 194 (H) 09/07/2022   HDL 38 (L) 09/07/2022   LDLCALC 125 (H) 09/07/2022   TRIG 172 (H) 09/07/2022   CHOLHDL 5 04/20/2022   Lab Results  Component Value Date   WBC 9.6 04/20/2022   HGB 13.4 04/20/2022   HCT 40.3 09/07/2022   MCV 80.1 04/20/2022   PLT 277.0 04/20/2022   Lab Results  Component Value Date   IRON 41 09/07/2022   TIBC 308 09/07/2022   FERRITIN 40 09/07/2022   Lab Results  Component Value Date   VD25OH 43.2 09/07/2022   VD25OH 55.1 05/12/2022    Attestation Statements:   Reviewed by clinician on day of visit: allergies, medications, problem list, medical history, surgical history, family history, social history, and previous encounter notes.  ***(delete if time-based billing not used) Time spent on visit including the items listed below was *** minutes.  -preparing to see the patient (e.g., review of tests, history, previous notes) -obtaining and/or reviewing separately obtained history -counseling and educating the patient/family/caregiver -documenting clinical information in the electronic or other health record -ordering medications, tests,  or procedures -independently interpreting results and communicating results to the patient/ family/caregiver -referring and communicating with other health care professionals  -care coordination

## 2022-11-04 ENCOUNTER — Telehealth (INDEPENDENT_AMBULATORY_CARE_PROVIDER_SITE_OTHER): Payer: BC Managed Care – PPO | Admitting: Family Medicine

## 2022-11-04 ENCOUNTER — Other Ambulatory Visit (HOSPITAL_COMMUNITY): Payer: Self-pay

## 2022-11-04 DIAGNOSIS — R7303 Prediabetes: Secondary | ICD-10-CM

## 2022-11-04 DIAGNOSIS — D508 Other iron deficiency anemias: Secondary | ICD-10-CM

## 2022-11-04 DIAGNOSIS — E7849 Other hyperlipidemia: Secondary | ICD-10-CM | POA: Diagnosis not present

## 2022-11-04 DIAGNOSIS — E669 Obesity, unspecified: Secondary | ICD-10-CM | POA: Diagnosis not present

## 2022-11-04 DIAGNOSIS — Z68.41 Body mass index (BMI) pediatric, greater than or equal to 95th percentile for age: Secondary | ICD-10-CM

## 2022-11-04 MED ORDER — POLYSACCHARIDE IRON COMPLEX 150 MG PO CAPS
150.0000 mg | ORAL_CAPSULE | Freq: Every day | ORAL | 0 refills | Status: DC
  Filled 2022-11-04 – 2022-12-01 (×3): qty 60, 60d supply, fill #0

## 2022-11-04 MED ORDER — SAXENDA 18 MG/3ML ~~LOC~~ SOPN
3.0000 mg | PEN_INJECTOR | Freq: Every day | SUBCUTANEOUS | 0 refills | Status: DC
  Filled 2022-11-04: qty 15, 30d supply, fill #0

## 2022-11-05 ENCOUNTER — Other Ambulatory Visit (HOSPITAL_COMMUNITY): Payer: Self-pay

## 2022-11-05 ENCOUNTER — Encounter (INDEPENDENT_AMBULATORY_CARE_PROVIDER_SITE_OTHER): Payer: Self-pay | Admitting: Family Medicine

## 2022-11-05 NOTE — Addendum Note (Signed)
Addended by: Joyice Magda W on: 11/05/2022 07:42 AM   Modules accepted: Level of Service  

## 2022-11-06 ENCOUNTER — Other Ambulatory Visit (HOSPITAL_COMMUNITY): Payer: Self-pay

## 2022-11-09 ENCOUNTER — Other Ambulatory Visit (HOSPITAL_COMMUNITY): Payer: Self-pay

## 2022-11-09 NOTE — Progress Notes (Signed)
Patient not seen today

## 2022-11-19 ENCOUNTER — Other Ambulatory Visit: Payer: Self-pay | Admitting: Bariatrics

## 2022-11-19 ENCOUNTER — Other Ambulatory Visit (HOSPITAL_COMMUNITY): Payer: Self-pay

## 2022-11-19 DIAGNOSIS — F330 Major depressive disorder, recurrent, mild: Secondary | ICD-10-CM

## 2022-11-19 DIAGNOSIS — F419 Anxiety disorder, unspecified: Secondary | ICD-10-CM

## 2022-11-19 MED ORDER — VENLAFAXINE HCL ER 37.5 MG PO CP24
37.5000 mg | ORAL_CAPSULE | Freq: Every day | ORAL | 0 refills | Status: DC
  Filled 2022-11-19 – 2023-01-26 (×2): qty 30, 30d supply, fill #0

## 2022-11-21 ENCOUNTER — Other Ambulatory Visit (HOSPITAL_COMMUNITY): Payer: Self-pay

## 2022-11-27 ENCOUNTER — Other Ambulatory Visit (HOSPITAL_COMMUNITY): Payer: Self-pay

## 2022-11-27 NOTE — Progress Notes (Deleted)
HPI:  [name] is a 22 y.o. female, who is here today to follow on recent visit.  Review of Systems See other pertinent positives and negatives in HPI.  Current Outpatient Medications on File Prior to Visit  Medication Sig Dispense Refill   albuterol (VENTOLIN HFA) 108 (90 Base) MCG/ACT inhaler Inhale 2 puffs into the lungs every 6 (six) hours as needed for wheezing or shortness of breath. 8 g 1   Insulin Pen Needle 31G X 5 MM MISC Will use daily with Saxenda 100 each 0   iron polysaccharides (NU-IRON) 150 MG capsule Take 1 capsule (150 mg total) by mouth daily. 60 capsule 0   Liraglutide -Weight Management (SAXENDA) 18 MG/3ML SOPN Inject 3 mg into the skin daily. 15 mL 0   NUVARING 0.12-0.015 MG/24HR vaginal ring Place vaginally.     venlafaxine XR (EFFEXOR-XR) 37.5 MG 24 hr capsule Take 1 capsule (37.5 mg total) by mouth daily with breakfast. 30 capsule 0   No current facility-administered medications on file prior to visit.    Past Medical History:  Diagnosis Date   Allergy    Anemia    Back pain    Depression    Eczema    IBS (irritable bowel syndrome)    Joint pain    SOB (shortness of breath)    No Known Allergies  Social History   Socioeconomic History   Marital status: Single    Spouse name: Not on file   Number of children: Not on file   Years of education: Not on file   Highest education level: Some college, no degree  Occupational History   Occupation: Ship broker At [employer]  Tobacco Use   Smoking status: Never   Smokeless tobacco: Never  Substance and Sexual Activity   Alcohol use: No   Drug use: No   Sexual activity: Never  Other Topics Concern   Not on file  Social History Narrative   Not on file   Social Determinants of Health   Financial Resource Strain: Low Risk  (04/20/2022)   Overall Financial Resource Strain (CARDIA)    Difficulty of Paying Living Expenses: Not hard at all  Food Insecurity: No Food Insecurity (04/20/2022)   Hunger  Vital Sign    Worried About Running Out of Food in the Last Year: Never true    Ran Out of Food in the Last Year: Never true  Transportation Needs: No Transportation Needs (04/20/2022)   PRAPARE - Hydrologist (Medical): No    Lack of Transportation (Non-Medical): No  Physical Activity: Sufficiently Active (04/20/2022)   Exercise Vital Sign    Days of Exercise per Week: 3 days    Minutes of Exercise per Session: 60 min  Stress: Stress Concern Present (04/20/2022)   [place]    Feeling of Stress : Rather much  Social Connections: Unknown (04/20/2022)   Social Connection and Isolation Panel [NHANES]    Frequency of Communication with Friends and Family: More than three times a week    Frequency of Social Gatherings with Friends and Family: Once a week    Attends Religious Services: Patient refused    Marine scientist or Organizations: No    Attends Music therapist: Not on file    Marital Status: Patient refused    There were no vitals filed for this visit. There is no height or weight on file to calculate BMI.  Physical Exam  ASSESSMENT AND PLAN:  There are no diagnoses linked to this encounter.  No orders of the defined types were placed in this encounter.   No problem-specific Assessment & Plan notes found for this encounter.   No follow-ups on file.  Betty G. [place], MD  [facility]. [place] office.

## 2022-11-30 ENCOUNTER — Ambulatory Visit: Payer: BC Managed Care – PPO | Admitting: Bariatrics

## 2022-11-30 ENCOUNTER — Ambulatory Visit: Payer: BC Managed Care – PPO | Admitting: Family Medicine

## 2022-11-30 ENCOUNTER — Encounter: Payer: Self-pay | Admitting: Bariatrics

## 2022-11-30 ENCOUNTER — Other Ambulatory Visit (HOSPITAL_COMMUNITY): Payer: Self-pay

## 2022-11-30 VITALS — BP 113/68 | HR 93 | Temp 98.0°F | Ht 66.0 in | Wt 246.0 lb

## 2022-11-30 DIAGNOSIS — R632 Polyphagia: Secondary | ICD-10-CM | POA: Insufficient documentation

## 2022-11-30 DIAGNOSIS — R7303 Prediabetes: Secondary | ICD-10-CM

## 2022-11-30 DIAGNOSIS — Z6839 Body mass index (BMI) 39.0-39.9, adult: Secondary | ICD-10-CM

## 2022-11-30 MED ORDER — SAXENDA 18 MG/3ML ~~LOC~~ SOPN
3.0000 mg | PEN_INJECTOR | Freq: Every day | SUBCUTANEOUS | 0 refills | Status: DC
  Filled 2022-11-30: qty 15, 30d supply, fill #0
  Filled 2022-12-25: qty 15, 30d supply, fill #1

## 2022-11-30 NOTE — Progress Notes (Unsigned)
HPI: Carol Walton Walton a 22 y.o. female, who Walton here today for follow up.  Last seen on 09/29/22.  Currently experiencing increased anxiety and depression, with anxiety being a constant issue and depression becoming more prominent lately. 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 Carol Walton reports that Carol Walton doing great in school. Carol Walton currently on Effexor XR 37.5 mg daily.  Carol Walton has tried Lexapro, which initially helped but eventually led to Carol Walton not feeling Carol Walton best. Carol Walton took Hydroxyzine for panic attacks, which Carol Walton reports was helpful. Carol Walton currently experiencing more severe panic attacks, no identified trigger factors.  Carol Walton sleeping an average of 8-9 hours per night and has been making an effort to prioritize sleep due to Carol Walton demanding school schedule.   Carol Walton reports a lack of motivation for activities outside of school and has limited socialization due to Carol Walton busy schedule. Carol Walton expresses frustration and sadness about Carol Walton current emotional state, despite having positive aspects in Carol Walton life, such as getting in nursing school.  Carol Walton has been seeing a therapist once every two weeks, which Carol Walton reports has been helpful.  Carol Walton denies any thoughts of self-harm or harm to others.    12/01/2022   11:49 AM 09/29/2022   12:35 PM 05/12/2022    9:18 AM 04/20/2022    1:59 PM 08/10/2017    5:25 PM  Depression screen PHQ 2/9  Decreased Interest 1 0 '2 1 2  '$ Down, Depressed, Hopeless 2 0 2 0 2  PHQ - 2 Score 3 0 '4 1 4  '$ Altered sleeping 1 0 '1 2 3  '$ Tired, decreased energy '3 1 3 1 3  '$ Change in appetite 0 0 '1 1 2  '$ Feeling bad or failure about yourself  '1 1 2 '$ 0 3  Trouble concentrating 0 0 0 0 2  Moving slowly or fidgety/restless 1 0 0 1 3  Suicidal thoughts 0 0 0 0 0  PHQ-9 Score '9 2 11 6 20  '$ Difficult doing work/chores Somewhat difficult Not difficult at all Very difficult Somewhat difficult Somewhat difficult      12/01/2022   11:49 AM  GAD 7 :  Generalized Anxiety Score  Nervous, Anxious, on Edge 3  Control/stop worrying 3  Worry too much - different things 2  Trouble relaxing 3  Restless 2  Easily annoyed or irritable 0  Afraid - awful might happen 0  Total GAD 7 Score 13  Anxiety Difficulty Somewhat difficult      12/01/2022   11:49 AM  GAD 7 : Generalized Anxiety Score  Nervous, Anxious, on Edge 3  Control/stop worrying 3  Worry too much - different things 2  Trouble relaxing 3  Restless 2  Easily annoyed or irritable 0  Afraid - awful might happen 0  Total GAD 7 Score 13  Anxiety Difficulty Somewhat difficult   Review of Systems  Constitutional:  Positive for fatigue. Negative for chills and fever.  Cardiovascular:  Negative for chest pain and palpitations.  Gastrointestinal:  Negative for abdominal pain, nausea and vomiting.  Endocrine: Negative for cold intolerance and heat intolerance.  Neurological:  Negative for syncope and weakness.  Psychiatric/Behavioral:  Negative for confusion and hallucinations. The patient Walton nervous/anxious.   See other pertinent positives and negatives in HPI.  Current Outpatient Medications on File Prior to Visit  Medication Sig Dispense Refill   albuterol (VENTOLIN HFA) 108 (90 Base) MCG/ACT inhaler Inhale 2  puffs into the lungs every 6 (six) hours as needed for wheezing or shortness of breath. 8 g 1   Insulin Pen Needle 31G X 5 MM MISC Will use daily with Saxenda 100 each 0   iron polysaccharides (NU-IRON) 150 MG capsule Take 1 capsule (150 mg total) by mouth daily. 60 capsule 0   Liraglutide -Weight Management (SAXENDA) 18 MG/3ML SOPN Inject 3 mg into the skin daily. 45 mL 0   NUVARING 0.12-0.015 MG/24HR vaginal ring Place vaginally.     venlafaxine XR (EFFEXOR-XR) 37.5 MG 24 hr capsule Take 1 capsule (37.5 mg total) by mouth daily with breakfast. 30 capsule 0   No current facility-administered medications on file prior to visit.   Past Medical History:  Diagnosis Date    Allergy    Anemia    Back pain    Depression    Eczema    IBS (irritable bowel syndrome)    Joint pain    SOB (shortness of breath)    No Known Allergies  Social History   Socioeconomic History   Marital status: Single    Spouse name: Not on file   Number of children: Not on file   Years of education: Not on file   Highest education level: Some college, no degree  Occupational History   Occupation: Ship broker At [employer]  Tobacco Use   Smoking status: Never   Smokeless tobacco: Never  Substance and Sexual Activity   Alcohol use: No   Drug use: No   Sexual activity: Never  Other Topics Concern   Not on file  Social History Narrative   Not on file   Social Determinants of Health   Financial Resource Strain: Low Risk  (04/20/2022)   Overall Financial Resource Strain (CARDIA)    Difficulty of Paying Living Expenses: Not hard at all  Food Insecurity: No Food Insecurity (04/20/2022)   Hunger Vital Sign    Worried About Running Out of Food in the Last Year: Never true    Ran Out of Food in the Last Year: Never true  Transportation Needs: No Transportation Needs (04/20/2022)   PRAPARE - Hydrologist (Medical): No    Lack of Transportation (Non-Medical): No  Physical Activity: Sufficiently Active (04/20/2022)   Exercise Vital Sign    Days of Exercise per Week: 3 days    Minutes of Exercise per Session: 60 min  Stress: Stress Concern Present (04/20/2022)   [place]    Feeling of Stress : Rather much  Social Connections: Unknown (04/20/2022)   Social Connection and Isolation Panel [NHANES]    Frequency of Communication with Friends and Family: More than three times a week    Frequency of Social Gatherings with Friends and Family: Once a week    Attends Religious Services: Patient refused    Active Member of Clubs or Organizations: No    Attends Archivist Meetings: Not on file     Marital Status: Patient refused   Vitals:   12/01/22 1144  BP: 110/70  Pulse: 100  Resp: 12  SpO2: 98%   Body mass index Walton 39.71 kg/m.  Physical Exam Vitals and nursing note reviewed.  Constitutional:      General: Carol Walton not in acute distress.    Appearance: Carol Walton well-developed.  HENT:     Head: Normocephalic and atraumatic.  Eyes:     Conjunctiva/sclera: Conjunctivae normal.  Cardiovascular:  Rate and Rhythm: Normal rate and regular rhythm.     Heart sounds: No murmur heard. Pulmonary:     Effort: Pulmonary effort Walton normal. No respiratory distress.     Breath sounds: Normal breath sounds.  Skin:    General: Skin Walton warm.     Findings: No erythema or rash.  Neurological:     General: No focal deficit present.     Mental Status: Carol Walton alert and oriented to person, place, and time.     Gait: Gait normal.  Psychiatric:        Mood and Affect: Mood Walton anxious. Affect Walton tearful.        Thought Content: Thought content does not include homicidal or suicidal ideation. Thought content does not include homicidal or suicidal plan.   ASSESSMENT AND PLAN:  Brender was seen today for medical management of chronic issues.  Diagnoses and all orders for this visit:  Depression, major, recurrent, mild ([place]) Assessment & Plan: Carol Walton like problem Walton getting worse. Options discussed. Carol Walton agrees with trying Wellbutrin, side effects discussed. Carol Walton will start Wellbutrin SR 100 mg daily for 7 days then bid. Continue Effexor XR 37.5 mg daily and CBT. Instructed about warning signs. F/U in 2 months.  Orders: -     buPROPion HCl ER (SR); Take 1 tablet (100 mg total) by mouth 2 (two) times daily.  Dispense: 60 tablet; Refill: 1  Anxiety disorder, unspecified type Assessment & Plan: Also getting worse. For now continue Effexor XR 37,5 mg daily. Today Wellbutrin added to help with depression. Hydroxyzine has helped with panic attacks, Rx sent to take 25 mg tid  prn. Continue CBT. F/U in 2 months.  Orders: -     hydrOXYzine Pamoate; Take 1 capsule (25 mg total) by mouth every 8 (eight) hours as needed for anxiety.  Dispense: 30 capsule; Refill: 0  Return in about 2 months (around 01/31/2023).  Alec Jaros G. Martinique, MD  [facility]. [place] office.

## 2022-12-01 ENCOUNTER — Encounter: Payer: Self-pay | Admitting: Family Medicine

## 2022-12-01 ENCOUNTER — Ambulatory Visit: Payer: BC Managed Care – PPO | Admitting: Family Medicine

## 2022-12-01 VITALS — BP 110/70 | HR 100 | Resp 12 | Ht 66.0 in | Wt 246.0 lb

## 2022-12-01 DIAGNOSIS — F330 Major depressive disorder, recurrent, mild: Secondary | ICD-10-CM

## 2022-12-01 DIAGNOSIS — F419 Anxiety disorder, unspecified: Secondary | ICD-10-CM

## 2022-12-01 MED ORDER — HYDROXYZINE PAMOATE 25 MG PO CAPS: 25.0000 mg | ORAL_CAPSULE | Freq: Three times a day (TID) | ORAL | 0 refills | Status: DC | PRN

## 2022-12-01 MED ORDER — BUPROPION HCL ER (SR) 100 MG PO TB12: 100.0000 mg | ORAL_TABLET | Freq: Two times a day (BID) | ORAL | 1 refills | Status: DC

## 2022-12-01 NOTE — Assessment & Plan Note (Signed)
Also getting worse. For now continue Effexor XR 37,5 mg daily. Today Wellbutrin added to help with depression. Hydroxyzine has helped with panic attacks, Rx sent to take 25 mg tid prn. Continue CBT. F/U in 2 months.

## 2022-12-01 NOTE — Assessment & Plan Note (Addendum)
She feels like problem is getting worse. Options discussed. She agrees with trying Wellbutrin, side effects discussed. She will start Wellbutrin SR 100 mg daily for 7 days then bid. Continue Effexor XR 37.5 mg daily and CBT. Instructed about warning signs. F/U in 2 months.

## 2022-12-01 NOTE — Patient Instructions (Addendum)
A few things to remember from today's visit:  Depression, major, recurrent, mild ([place]) - Plan: buPROPion ER (WELLBUTRIN SR) 100 MG 12 hr tablet  Anxiety disorder, unspecified type  Wellbutrin SR added today, take it once daily for 7 days then 2 times daily. No change sin Effexor for now but we can increase dose in a few weeks depending how you feel with new medication.  If you need refills for medications you take chronically, please call your pharmacy. Do not use My Chart to request refills or for acute issues that need immediate attention. If you send a my chart message, it may take a few days to be addressed, specially if I am not in the office.  Please be sure medication list is accurate. If a new problem present, please set up appointment sooner than planned today.

## 2022-12-02 ENCOUNTER — Other Ambulatory Visit (HOSPITAL_COMMUNITY): Payer: Self-pay

## 2022-12-04 ENCOUNTER — Encounter: Payer: Self-pay | Admitting: Family Medicine

## 2022-12-09 NOTE — Progress Notes (Unsigned)
Chief Complaint:   [place] is here to discuss her progress with her obesity treatment plan along with follow-up of her obesity related diagnoses. Aleesha is on keeping a food journal with goal of 1500-1700 calories and 90-100 grams of protein daily and states she is following her eating plan approximately 50% of the time. Juliette states she is walking 2 miles daily.  Today's visit was #: 9 Starting weight: 257 lbs Starting date: 05/12/22 Today's weight: 243 lbs Today's date: 11/30/22 Total lbs lost to date: 14 Total lbs lost since last in-office visit: -3  Interim History: She is down 3 pounds since her last visit.  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  She has not been able to get to the 1500 cal.  Subjective:   1. Prediabetes Taking Saxenda.  2. Polyphagia Taking Saxenda.  Assessment/Plan:   1. Prediabetes 1.  Continue Saxenda. 2.  Continue to keep carbohydrates low. - Liraglutide -Weight Management (SAXENDA) 18 MG/3ML SOPN; Inject 3 mg into the skin daily.  Dispense: 45 mL; Refill: 0  2. Polyphagia Refill- - Liraglutide -Weight Management (SAXENDA) 18 MG/3ML SOPN; Inject 3 mg into the skin daily.  Dispense: 45 mL; Refill: 0 Increase dose to 1.8 mg daily from 1.2 mg daily. Increase fiber, water, protein.  3. Morbid obesity (HCC) BMI 39.0-39.9,adult 1.  Meal planning. 2.  Eat protein first, keep water and fiber high. 3.  Will begin collagen pills, exercise.  Emyly is currently in the action stage of change. As such, her goal is to continue with weight loss efforts. She has agreed to keeping a food journal with goal of 1500-1700 calories and 90-100 grams of protein daily.  Exercise goals: More active at work, 2 days/upper body.  Behavioral modification strategies: increasing lean protein intake, decreasing simple carbohydrates, increasing vegetables, increasing water intake, decreasing eating out, no skipping meals, meal planning and cooking  strategies, keeping healthy foods in the home, and planning for success.  Treasea has agreed to follow-up with our clinic in 4 weeks. She was informed of the importance of frequent follow-up visits to maximize her success with intensive lifestyle modifications for her multiple health conditions.   Objective:   Blood pressure 113/68, pulse 93, temperature 98 F (36.7 C), height '5\' 6"'$  (1.676 m), weight 246 lb (111.6 kg), SpO2 97 %. Body mass index is 39.71 kg/m.  General: Cooperative, alert, well developed, in no acute distress. HEENT: Conjunctivae and lids unremarkable. Cardiovascular: Regular rhythm.  Lungs: Normal work of breathing. Neurologic: No focal deficits.   Lab Results  Component Value Date   CREATININE 0.75 09/07/2022   BUN 11 09/07/2022   NA 139 09/07/2022   K 3.8 09/07/2022   CL 104 09/07/2022   CO2 22 09/07/2022   Lab Results  Component Value Date   ALT 19 09/07/2022   AST 13 09/07/2022   ALKPHOS 70 09/07/2022   BILITOT <0.2 09/07/2022   Lab Results  Component Value Date   HGBA1C 5.3 09/07/2022   HGBA1C 5.7 04/20/2022   Lab Results  Component Value Date   INSULIN 38.3 (H) 09/07/2022   INSULIN 29.1 (H) 05/12/2022   Lab Results  Component Value Date   TSH 1.59 04/20/2022   Lab Results  Component Value Date   CHOL 194 (H) 09/07/2022   HDL 38 (L) 09/07/2022   LDLCALC 125 (H) 09/07/2022   TRIG 172 (H) 09/07/2022   CHOLHDL 5 04/20/2022   Lab Results  Component Value  Date   VD25OH 43.2 09/07/2022   VD25OH 55.1 05/12/2022   Lab Results  Component Value Date   WBC 9.6 04/20/2022   HGB 13.4 04/20/2022   HCT 40.3 09/07/2022   MCV 80.1 04/20/2022   PLT 277.0 04/20/2022   Lab Results  Component Value Date   IRON 41 09/07/2022   TIBC 308 09/07/2022   FERRITIN 40 09/07/2022    Attestation Statements:   Reviewed by clinician on day of visit: allergies, medications, problem list, medical history, surgical history, family history, social history,  and previous encounter notes.  I, Dawn Whitmire, FNP-C, am acting as transcriptionist for Dr. Jearld Lesch.  I have reviewed the above documentation for accuracy and completeness, and I agree with the above. Jearld Lesch, DO

## 2022-12-10 ENCOUNTER — Encounter: Payer: Self-pay | Admitting: Bariatrics

## 2022-12-23 ENCOUNTER — Other Ambulatory Visit: Payer: Self-pay

## 2022-12-23 DIAGNOSIS — F330 Major depressive disorder, recurrent, mild: Secondary | ICD-10-CM

## 2022-12-23 MED ORDER — BUPROPION HCL ER (SR) 100 MG PO TB12: 100.0000 mg | ORAL_TABLET | Freq: Two times a day (BID) | ORAL | 1 refills | Status: DC

## 2023-01-26 ENCOUNTER — Other Ambulatory Visit: Payer: Self-pay | Admitting: Family Medicine

## 2023-01-26 ENCOUNTER — Other Ambulatory Visit (INDEPENDENT_AMBULATORY_CARE_PROVIDER_SITE_OTHER): Payer: Self-pay | Admitting: Family Medicine

## 2023-01-26 ENCOUNTER — Other Ambulatory Visit (HOSPITAL_COMMUNITY): Payer: Self-pay

## 2023-01-26 ENCOUNTER — Other Ambulatory Visit: Payer: Self-pay

## 2023-01-26 DIAGNOSIS — D508 Other iron deficiency anemias: Secondary | ICD-10-CM

## 2023-01-26 DIAGNOSIS — F419 Anxiety disorder, unspecified: Secondary | ICD-10-CM

## 2023-01-26 MED ORDER — HYDROXYZINE PAMOATE 25 MG PO CAPS
25.0000 mg | ORAL_CAPSULE | Freq: Three times a day (TID) | ORAL | 2 refills | Status: DC | PRN
  Filled 2023-01-26: qty 30, 10d supply, fill #0

## 2023-02-03 ENCOUNTER — Other Ambulatory Visit (HOSPITAL_COMMUNITY): Payer: Self-pay

## 2023-02-10 ENCOUNTER — Other Ambulatory Visit: Payer: Self-pay | Admitting: Family Medicine

## 2023-02-10 DIAGNOSIS — F330 Major depressive disorder, recurrent, mild: Secondary | ICD-10-CM

## 2023-02-10 DIAGNOSIS — F419 Anxiety disorder, unspecified: Secondary | ICD-10-CM

## 2023-02-12 ENCOUNTER — Other Ambulatory Visit: Payer: Self-pay | Admitting: Family Medicine

## 2023-02-12 ENCOUNTER — Other Ambulatory Visit (INDEPENDENT_AMBULATORY_CARE_PROVIDER_SITE_OTHER): Payer: Self-pay | Admitting: Family Medicine

## 2023-02-12 DIAGNOSIS — D508 Other iron deficiency anemias: Secondary | ICD-10-CM

## 2023-02-26 ENCOUNTER — Other Ambulatory Visit (HOSPITAL_COMMUNITY): Payer: Self-pay

## 2023-03-10 ENCOUNTER — Other Ambulatory Visit: Payer: Self-pay | Admitting: Bariatrics

## 2023-03-10 DIAGNOSIS — R7303 Prediabetes: Secondary | ICD-10-CM

## 2023-03-15 ENCOUNTER — Other Ambulatory Visit (HOSPITAL_COMMUNITY): Payer: Self-pay

## 2023-03-18 ENCOUNTER — Ambulatory Visit: Payer: BC Managed Care – PPO | Admitting: Bariatrics

## 2023-03-18 ENCOUNTER — Other Ambulatory Visit (HOSPITAL_COMMUNITY): Payer: Self-pay

## 2023-03-18 ENCOUNTER — Encounter: Payer: Self-pay | Admitting: Bariatrics

## 2023-03-18 DIAGNOSIS — Z6838 Body mass index (BMI) 38.0-38.9, adult: Secondary | ICD-10-CM | POA: Diagnosis not present

## 2023-03-18 DIAGNOSIS — R632 Polyphagia: Secondary | ICD-10-CM | POA: Diagnosis not present

## 2023-03-18 DIAGNOSIS — E669 Obesity, unspecified: Secondary | ICD-10-CM

## 2023-03-18 DIAGNOSIS — R7303 Prediabetes: Secondary | ICD-10-CM | POA: Diagnosis not present

## 2023-03-18 MED ORDER — PHENTERMINE-TOPIRAMATE ER 3.75-23 MG PO CP24
ORAL_CAPSULE | ORAL | 0 refills | Status: DC
  Filled 2023-03-18: qty 14, 14d supply, fill #0

## 2023-03-18 MED ORDER — QSYMIA 7.5-46 MG PO CP24
ORAL_CAPSULE | ORAL | 0 refills | Status: DC
  Filled 2023-03-18: qty 30, 30d supply, fill #0

## 2023-03-18 MED ORDER — INSULIN PEN NEEDLE 31G X 5 MM MISC
0 refills | Status: DC
  Filled 2023-03-18: qty 100, 100d supply, fill #0

## 2023-03-18 NOTE — Progress Notes (Signed)
WEIGHT SUMMARY AND BIOMETRICS  Weight Lost Since Last Visit: 9lb  Vitals Temp: 98 F (36.7 C) BP: 130/79 Pulse Rate: 76 SpO2: 98 %   Anthropometric Measurements Height: 5\' 6"  (1.676 m) Weight: 237 lb (107.5 kg) BMI (Calculated): 38.27 Weight at Last Visit: 246lb Weight Lost Since Last Visit: 9lb Starting Weight: 257lb Total Weight Loss (lbs): 23 lb (10.4 kg)   Body Composition  Body Fat %: 44.3 % Fat Mass (lbs): 105.2 lbs Muscle Mass (lbs): 125.8 lbs Total Body Water (lbs): 92.8 lbs Visceral Fat Rating : 9   Other Clinical Data Fasting: no Labs: no Today's Visit #: 10 Starting Date: 05/12/22    OBESITY Carol Walton is here to discuss her progress with her obesity treatment plan along with follow-up of her obesity related diagnoses.     Nutrition Plan: keeping a food journal with goal of 1500-1700 calories and 90-100 grams of protein daily - 85-90% adherence.  Current exercise: walking and weightlifting  Interim History:  She is down 9 lbs since her last visit.  Eating all of the food on the plan., Not meeting calorie goals., Reports polyphagia, and Denies excessive cravings.  Pharmacotherapy: Carol Walton is on Saxenda 3 mg SQ daily ( insurance no longer paying ).  Adverse side effects: None Hunger is moderately controlled.  Cravings are moderately controlled.  Assessment/Plan:   1. Prediabetes Last A1c was 5.3  Medication(s): Saxenda Saxenda 3 mg SQ daily Lab Results  Component Value Date   HGBA1C 5.3 09/07/2022   HGBA1C 5.7 04/20/2022   Lab Results  Component Value Date   INSULIN 38.3 (H) 09/07/2022   INSULIN 29.1 (H) 05/12/2022    Plan: Will minimize all refined carbohydrates both sweets and starches.  Will work on the plan and exercise.  Consider both aerobic and resistance training.  Will keep protein, water, and fiber intake high.  Increase Polyunsaturated and Monounsaturated fats to increase satiety and encourage weight loss.  Aim  for 7 to 9 hours of sleep nightly.  Will continue medications.  Start Qsymia 7.5/46 mg 1 capsule by mouth daily in am   2. Polyphagia Polyphagia Carol Walton endorses excessive hunger.  Medication(s): Saxenda Effects of medication:  moderately controlled. Cravings are moderately controlled.   Plan: Medication(s): Qsymia 7.5/46 mg 1 capsule by mouth daily in am Will increase water, protein and fiber to help assuage hunger.  Will minimize foods that have a high glucose index/load to minimize reactive hypoglycemia.    Generalized Obesity: Current BMI BMI (Calculated): 38.27   Pharmacotherapy Plan Start  Qsymia 7.5/46 mg 1 capsule by mouth daily in am Will start Qsymia today. Checked the PDMP, and controlled medication sheet for Phentermine/Qsymia reviewed and signed with the patient. She/He denies contraindications. Benefits and risks were discussed.   Rx: Qsymia 3.75/23 mg capsule 1 daily #30 with no refills. Qsymia 7.5/46 mg 1 daily # 30 with no refills. Sent to local pharmacy.  Discussed the need for reliable birth control and to use double protection. Discussed the Qsymia can be teratogenic.   Given written information for [employer] to use if not covered by insurance.   Carol Walton is currently in the action stage of change. As such, her goal is to continue with weight loss efforts.  She has agreed to keeping a food journal with goal of 1,500 to 1,700 calories and 90 to 100 grams of protein daily. Will add extra protein with snacks.   Exercise goals: No exercise has been prescribed at this time. For substantial health  benefits, adults should do at least 150 minutes (2 hours and 30 minutes) a week of moderate-intensity, or 75 minutes (1 hour and 15 minutes) a week of vigorous-intensity aerobic physical activity, or an equivalent combination of moderate- and vigorous-intensity aerobic activity. Aerobic activity should be performed in episodes of at least 10 minutes, and preferably, it should be  spread throughout the week.  Behavioral modification strategies: increasing lean protein intake, no meal skipping, meal planning , planning for success, and mindful eating.  Carol Walton has agreed to follow-up with our clinic in 4 weeks.       Objective:   VITALS: Per patient if applicable, see vitals. GENERAL: Alert and in no acute distress. CARDIOPULMONARY: No increased WOB. Speaking in clear sentences.  PSYCH: Pleasant and cooperative. Speech normal rate and rhythm. Affect is appropriate. Insight and judgement are appropriate. Attention is focused, linear, and appropriate.  NEURO: Oriented as arrived to appointment on time with no prompting.   Attestation Statements:   This was prepared with the assistance of Engineer, civil (consulting).  Occasional wrong-word or sound-a-like substitutions may have occurred due to the inherent limitations of voice recognition software.   Corinna Capra, DO

## 2023-03-19 ENCOUNTER — Other Ambulatory Visit (HOSPITAL_COMMUNITY): Payer: Self-pay

## 2023-03-24 ENCOUNTER — Telehealth: Payer: Self-pay

## 2023-03-24 ENCOUNTER — Encounter: Payer: Self-pay | Admitting: Bariatrics

## 2023-03-24 NOTE — Telephone Encounter (Signed)
Started PA for Qsymia via covermymeds. 

## 2023-03-24 NOTE — Telephone Encounter (Signed)
Questions submitted via cover my meds.  Waiting for response.

## 2023-03-25 NOTE — Telephone Encounter (Signed)
Qsymia approved 03/24/23-06/24/23 [id number]

## 2023-03-26 ENCOUNTER — Other Ambulatory Visit (HOSPITAL_COMMUNITY): Payer: Self-pay

## 2023-03-29 ENCOUNTER — Other Ambulatory Visit (HOSPITAL_COMMUNITY): Payer: Self-pay

## 2023-03-29 NOTE — Telephone Encounter (Signed)
[facility]  received a request from your provider for coverage of Qsymia (phentermine-topiramate extended-release). As long as you remain covered by the [facility] and there are no changes to your plan benefits, this request is approved for the following time period: 03/24/2023 - 06/24/2023

## 2023-03-31 ENCOUNTER — Other Ambulatory Visit (HOSPITAL_COMMUNITY): Payer: Self-pay

## 2023-04-14 ENCOUNTER — Encounter: Payer: Self-pay | Admitting: Bariatrics

## 2023-04-14 ENCOUNTER — Other Ambulatory Visit (INDEPENDENT_AMBULATORY_CARE_PROVIDER_SITE_OTHER): Payer: Self-pay | Admitting: Family Medicine

## 2023-04-14 ENCOUNTER — Other Ambulatory Visit (HOSPITAL_COMMUNITY): Payer: Self-pay

## 2023-04-14 ENCOUNTER — Ambulatory Visit: Payer: BC Managed Care – PPO | Admitting: Bariatrics

## 2023-04-14 VITALS — BP 103/67 | HR 45 | Temp 98.5°F | Ht 66.0 in | Wt 239.0 lb

## 2023-04-14 DIAGNOSIS — D508 Other iron deficiency anemias: Secondary | ICD-10-CM

## 2023-04-14 DIAGNOSIS — Z6838 Body mass index (BMI) 38.0-38.9, adult: Secondary | ICD-10-CM

## 2023-04-14 DIAGNOSIS — E7849 Other hyperlipidemia: Secondary | ICD-10-CM

## 2023-04-14 DIAGNOSIS — R632 Polyphagia: Secondary | ICD-10-CM

## 2023-04-14 DIAGNOSIS — E785 Hyperlipidemia, unspecified: Secondary | ICD-10-CM

## 2023-04-14 DIAGNOSIS — R7303 Prediabetes: Secondary | ICD-10-CM

## 2023-04-14 DIAGNOSIS — E669 Obesity, unspecified: Secondary | ICD-10-CM

## 2023-04-14 MED ORDER — POLYSACCHARIDE IRON COMPLEX 150 MG PO CAPS
150.0000 mg | ORAL_CAPSULE | Freq: Every day | ORAL | 0 refills | Status: DC
Start: 2023-04-14 — End: 2023-09-25
  Filled 2023-04-14: qty 60, 60d supply, fill #0

## 2023-04-14 MED ORDER — QSYMIA 7.5-46 MG PO CP24
1.0000 | ORAL_CAPSULE | Freq: Every morning | ORAL | 0 refills | Status: DC
Start: 2023-04-14 — End: 2023-05-12
  Filled 2023-04-14: qty 30, 30d supply, fill #0

## 2023-04-14 NOTE — Progress Notes (Signed)
WEIGHT SUMMARY AND BIOMETRICS  Weight Gained Since Last Visit: 2lb   Vitals Temp: 98.5 F (36.9 C) BP: 103/67 Pulse Rate: (!) 45 SpO2: 99 %   Anthropometric Measurements Height: 5\' 6"  (1.676 m) Weight: 239 lb (108.4 kg) BMI (Calculated): 38.59 Weight at Last Visit: 237lb Weight Gained Since Last Visit: 2lb Starting Weight: 257lb Total Weight Loss (lbs): 21 lb (9.526 kg)   Body Composition  Body Fat %: 43.2 % Fat Mass (lbs): 103.4 lbs Muscle Mass (lbs): 129 lbs Total Body Water (lbs): 89.8 lbs Visceral Fat Rating : 9   Other Clinical Data Fasting: yes Labs: yes Today's Visit #: 11 Starting Date: 05/12/22    OBESITY Carol Walton is here to discuss her progress with her obesity treatment plan along with follow-up of her obesity related diagnoses.     Nutrition Plan: keeping a food journal with goal of 1500-1700 calories and 90-100 grams of protein daily - 90% adherence.  Current exercise: cardiovascular workout on exercise equipment, walking, and pilates  Interim History:  She is up 2 lbs since her last visit. Eating all of the food on the plan., Is not skipping meals, and Water intake is adequate.  Pharmacotherapy: Desar[place] is on Qsymia 7.5/46 mg 1 capsule by mouth daily in am Adverse side effects: None Hunger is moderately controlled.  Cravings are moderately controlled.  Assessment/Plan:   Prediabetes Last A1c was 5.3, but had been 5.7   Medication(s): none specific for prediabetes.   Lab Results  Component Value Date   HGBA1C 5.3 09/07/2022   HGBA1C 5.7 04/20/2022   Lab Results  Component Value Date   INSULIN 38.3 (H) 09/07/2022   INSULIN 29.1 (H) 05/12/2022    Plan: Will minimize all refined carbohydrates both sweets and starches.  Will work on the plan and exercise.  Consider both aerobic and resistance training.  Will keep protein, water, and fiber intake high.  Increase Polyunsaturated and Monounsaturated fats to increase  satiety and encourage weight loss.  Aim for 7 to 9 hours of sleep nightly.   Continue and refill Qsymia 7.5/46 mg 1 capsule by mouth daily in am  Iron deficiency Anemia:  She has a history of anemia and is taking iron  Plan: will check an anemia panel and ferritin.    Hyperlipidemia LDL is not at goal. Medication(s) none secondary to age.  Cardiovascular risk factors: obesity (BMI >= 30 kg/m2)  Lab Results  Component Value Date   CHOL 194 (H) 09/07/2022   HDL 38 (L) 09/07/2022   LDLCALC 125 (H) 09/07/2022   TRIG 172 (H) 09/07/2022   CHOLHDL 5 04/20/2022   Lab Results  Component Value Date   ALT 19 09/07/2022   AST 13 09/07/2022   ALKPHOS 70 09/07/2022   BILITOT <0.2 09/07/2022   The ASCVD Risk score (Arnett DK, et al., 2019) failed to calculate for the following reasons:   The 2019 ASCVD risk score is only valid for ages 73 to 3  Plan:  Will avoid all trans fats.  Will read labels Will minimize saturated fats except the following: low fat meats in moderation, diary, and limited dark chocolate.  Increase Omega 3 in foods Will continue exercise and increase over time.    Labs done today (CMP, Lipids, HgbA1c, insulin, anemia panel and ferritin).    Generalized Obesity: Current BMI BMI (Calculated): 38.59   Pharmacotherapy Plan Continue and refill  Qsymia 7.5/46 mg 1 capsule by mouth daily in am  [name] is currently in the  action stage of change. As such, her goal is to continue with weight loss efforts.  She has agreed to keeping a food journal with goal of 1,500 calories and 90 grams of protein daily.  Exercise goals: For substantial health benefits, adults should do at least 150 minutes (2 hours and 30 minutes) a week of moderate-intensity, or 75 minutes (1 hour and 15 minutes) a week of vigorous-intensity aerobic physical activity, or an equivalent combination of moderate- and vigorous-intensity aerobic activity. Aerobic activity should be performed in episodes  of at least 10 minutes, and preferably, it should be spread throughout the week.  Behavioral modification strategies: increasing lean protein intake, decreasing simple carbohydrates , no meal skipping, meal planning , and planning for success.  Elysha has agreed to follow-up with Dawn in 4 weeks and with me in 8 weeks.     Medications Discontinued During This Encounter  Medication Reason   Liraglutide -Weight Management (SAXENDA) 18 MG/3ML SOPN Patient Preference       Objective:   VITALS: Per patient if applicable, see vitals. GENERAL: Alert and in no acute distress. CARDIOPULMONARY: No increased WOB. Speaking in clear sentences.  PSYCH: Pleasant and cooperative. Speech normal rate and rhythm. Affect is appropriate. Insight and judgement are appropriate. Attention is focused, linear, and appropriate.  NEURO: Oriented as arrived to appointment on time with no prompting.   Attestation Statements:    This was prepared with the assistance of Engineer, civil (consulting).  Occasional wrong-word or sound-a-like substitutions may have occurred due to the inherent limitations of voice recognition software.   Corinna Capra, DO

## 2023-04-15 ENCOUNTER — Other Ambulatory Visit (HOSPITAL_COMMUNITY): Payer: Self-pay

## 2023-04-15 LAB — COMPREHENSIVE METABOLIC PANEL
ALT: 16 IU/L (ref 0–32)
AST: 17 IU/L (ref 0–40)
Albumin: 4.2 g/dL (ref 4.0–5.0)
Alkaline Phosphatase: 81 IU/L (ref 42–106)
BUN/Creatinine Ratio: 15 (ref 9–23)
BUN: 11 mg/dL (ref 6–20)
Bilirubin Total: 0.3 mg/dL (ref 0.0–1.2)
CO2: 21 mmol/L (ref 20–29)
Calcium: 9.4 mg/dL (ref 8.7–10.2)
Chloride: 103 mmol/L (ref 96–106)
Creatinine, Ser: 0.72 mg/dL (ref 0.57–1.00)
Globulin, Total: 2.5 g/dL (ref 1.5–4.5)
Glucose: 97 mg/dL (ref 70–99)
Potassium: 4.2 mmol/L (ref 3.5–5.2)
Sodium: 140 mmol/L (ref 134–144)
Total Protein: 6.7 g/dL (ref 6.0–8.5)
eGFR: 123 mL/min/{1.73_m2} (ref >59)

## 2023-04-15 LAB — ANEMIA PANEL
Ferritin: 28 ng/mL (ref 15–150)
Folate, Hemolysate: 305 ng/mL
Folate, RBC: 790 ng/mL (ref >498)
Hematocrit: 38.6 % (ref 34.0–46.6)
Iron Saturation: 15 % (ref 15–55)
Iron: 48 ug/dL (ref 27–159)
Retic Ct Pct: 1.6 % (ref 0.6–2.6)
Total Iron Binding Capacity: 328 ug/dL (ref 250–450)
UIBC: 280 ug/dL (ref 131–425)
Vitamin B-12: 284 pg/mL (ref 232–1245)

## 2023-04-15 LAB — LIPID PANEL WITH LDL/HDL RATIO
Cholesterol, Total: 235 mg/dL — ABNORMAL HIGH (ref 100–199)
HDL: 55 mg/dL (ref >39)
LDL Chol Calc (NIH): 162 mg/dL — ABNORMAL HIGH (ref 0–99)
LDL/HDL Ratio: 2.9 ratio (ref 0.0–3.2)
Triglycerides: 101 mg/dL (ref 0–149)
VLDL Cholesterol Cal: 18 mg/dL (ref 5–40)

## 2023-04-15 LAB — HEMOGLOBIN A1C
Est. average glucose Bld gHb Est-mCnc: 103 mg/dL
Hgb A1c MFr Bld: 5.2 % (ref 4.8–5.6)

## 2023-04-15 LAB — INSULIN, RANDOM: INSULIN: 26.3 u[IU]/mL — ABNORMAL HIGH (ref 2.6–24.9)

## 2023-04-16 ENCOUNTER — Ambulatory Visit: Payer: BC Managed Care – PPO | Admitting: Family Medicine

## 2023-04-17 ENCOUNTER — Other Ambulatory Visit (HOSPITAL_COMMUNITY): Payer: Self-pay

## 2023-04-19 ENCOUNTER — Ambulatory Visit: Payer: BC Managed Care – PPO | Admitting: Family Medicine

## 2023-04-19 ENCOUNTER — Encounter: Payer: Self-pay | Admitting: Family Medicine

## 2023-04-19 VITALS — BP 120/80 | HR 99 | Temp 98.7°F | Wt 245.0 lb

## 2023-04-19 DIAGNOSIS — Z91018 Allergy to other foods: Secondary | ICD-10-CM

## 2023-04-19 MED ORDER — EPINEPHRINE 0.3 MG/0.3ML IJ SOAJ: 0.3000 mg | INTRAMUSCULAR | 2 refills | Status: AC | PRN

## 2023-04-19 NOTE — Progress Notes (Signed)
   Subjective:    Patient ID: Carol Walton, female    DOB: October 31, 2000, 22 y.o.   MRN: [id number]  HPI Here for some food allergies. She says she has had some numbness on her tongue for years when she eats carrots, but over the past 2 weeks she now gets numbness in her lips as well. Also she recently ate some cucumbers (which have never bothered her in the past), and her throat closed up making it hard to breathe. She took 2 Benadryl tablets and this faded away.    Review of Systems  Constitutional: Negative.   HENT:  Positive for trouble swallowing.   Eyes: Negative.   Respiratory: Negative.    Neurological:  Positive for numbness.       Objective:   Physical Exam Constitutional:      Appearance: Normal appearance.  HENT:     Nose: Nose normal.     Mouth/Throat:     Pharynx: Oropharynx is clear.  Cardiovascular:     Rate and Rhythm: Normal rate and regular rhythm.     Pulses: Normal pulses.     Heart sounds: Normal heart sounds.  Pulmonary:     Effort: Pulmonary effort is normal.     Breath sounds: Normal breath sounds.  Lymphadenopathy:     Cervical: No cervical adenopathy.  Neurological:     Mental Status: She is alert.           Assessment & Plan:  Food allergies. We will supply her with an EpiPen to keep with her at all times. We will also refer her to Allergy for testing.  Gershon Crane, MD

## 2023-05-05 ENCOUNTER — Other Ambulatory Visit: Payer: Self-pay | Admitting: Bariatrics

## 2023-05-05 DIAGNOSIS — E669 Obesity, unspecified: Secondary | ICD-10-CM

## 2023-05-05 DIAGNOSIS — R632 Polyphagia: Secondary | ICD-10-CM

## 2023-05-11 NOTE — Progress Notes (Unsigned)
  TeleHealth Visit:  This visit was completed with telemedicine (audio/video) technology. Rushell has verbally consented to this TeleHealth visit. The patient is located at home, the provider is located at home. The participants in this visit include the listed provider and patient. The visit was conducted today via MyChart video.  OBESITY Jemmah is here to discuss her progress with her obesity treatment plan along with follow-up of her obesity related diagnoses.   Today's visit was # 12 Starting weight: 257 lbs Starting date: 05/12/22 Weight at last in office visit: 239 lbs on 04/14/23 Total weight loss: 239 lbs at last in office visit on 04/14/23. Today's reported weight (***): {dwwweightreported:29243}  Nutrition Plan: keeping a food journal with goal of 1500 calories and 90 grams of protein daily - ***% adherence.  Current exercise: {exercise types:16438} cardiovascular workout on exercise equipment, walking, and pilates   Interim History:  ***  GO OVER {dwwnutritionassessment:28854}  Eating all of the prescribed protein: {yes***/no:17258} Skipping meals: {dwwyes:29172} Drinking adequate water: {dwwyes:29172} Drinking sugar sweetened beverages: {dwwyes:29172} Hunger controlled: {EWCONTROLASSESSMENT:24261}. Cravings controlled:  {EWCONTROLASSESSMENT:24261}.  Journaling Consistently:  {dwwyes:29172} Meeting protein goals:  {dwwyes:29172} Meeting calorie goals:  {dwwyes:29172}   Pharmacotherapy: Yolotzin is on {dwwpharmacotherapy:29109} Adverse side effects: {dwwse:29122} Hunger is {EWCONTROLASSESSMENT:24261}.  Cravings are {EWCONTROLASSESSMENT:24261}.  Assessment/Plan:  We discussed recent lab results in depth.  1. ***  2. ***  3. ***  Morbid Obesity: Current BMI 38  Pharmacotherapy Plan {dwwmed:29123}  {dwwpharmacotherapy:29109}  Kanika {CHL AMB IS/IS NOT:210130109} currently in the action stage of change. As such, her goal is to {MWMwtloss#1:210800005}.   She has agreed to {dwwsldiets:29085}.  Exercise goals: {MWM EXERCISE RECS:23473}  Behavioral modification strategies: {dwwslwtlossstrategies:29088}.  Rayleigh has agreed to follow-up with our clinic in {NUMBER 1-10:22536} {dwwfutime:29619}  No orders of the defined types were placed in this encounter.   There are no discontinued medications.   No orders of the defined types were placed in this encounter.     Objective:   VITALS: Per patient if applicable, see vitals. GENERAL: Alert and in no acute distress. CARDIOPULMONARY: No increased WOB. Speaking in clear sentences.  PSYCH: Pleasant and cooperative. Speech normal rate and rhythm. Affect is appropriate. Insight and judgement are appropriate. Attention is focused, linear, and appropriate.  NEURO: Oriented as arrived to appointment on time with no prompting.   Attestation Statements:   Reviewed by clinician on day of visit: allergies, medications, problem list, medical history, surgical history, family history, social history, and previous encounter notes.  ***(delete if time-based billing not used) Time spent on visit including the items listed below was *** minutes.  -preparing to see the patient (e.g., review of tests, history, previous notes) -obtaining and/or reviewing separately obtained history -counseling and educating the patient/family/caregiver -documenting clinical information in the electronic or other health record -ordering medications, tests, or procedures -independently interpreting results and communicating results to the patient/ family/caregiver -referring and communicating with other health care professionals  -care coordination   This was prepared with the assistance of Engineer, civil (consulting).  Occasional wrong-word or sound-a-like substitutions may have occurred due to the inherent limitations of voice recognition software.

## 2023-05-12 ENCOUNTER — Other Ambulatory Visit (HOSPITAL_COMMUNITY): Payer: Self-pay

## 2023-05-12 ENCOUNTER — Other Ambulatory Visit (INDEPENDENT_AMBULATORY_CARE_PROVIDER_SITE_OTHER): Payer: Self-pay | Admitting: Bariatrics

## 2023-05-12 ENCOUNTER — Encounter (INDEPENDENT_AMBULATORY_CARE_PROVIDER_SITE_OTHER): Payer: Self-pay | Admitting: Family Medicine

## 2023-05-12 ENCOUNTER — Telehealth (INDEPENDENT_AMBULATORY_CARE_PROVIDER_SITE_OTHER): Payer: BC Managed Care – PPO | Admitting: Family Medicine

## 2023-05-12 DIAGNOSIS — E7849 Other hyperlipidemia: Secondary | ICD-10-CM

## 2023-05-12 DIAGNOSIS — E88819 Insulin resistance, unspecified: Secondary | ICD-10-CM | POA: Diagnosis not present

## 2023-05-12 DIAGNOSIS — K5909 Other constipation: Secondary | ICD-10-CM | POA: Diagnosis not present

## 2023-05-12 DIAGNOSIS — E669 Obesity, unspecified: Secondary | ICD-10-CM

## 2023-05-12 DIAGNOSIS — Z68.41 Body mass index (BMI) pediatric, greater than or equal to 95th percentile for age: Secondary | ICD-10-CM

## 2023-05-12 DIAGNOSIS — E538 Deficiency of other specified B group vitamins: Secondary | ICD-10-CM

## 2023-05-12 DIAGNOSIS — Z6838 Body mass index (BMI) 38.0-38.9, adult: Secondary | ICD-10-CM

## 2023-05-12 DIAGNOSIS — R632 Polyphagia: Secondary | ICD-10-CM

## 2023-05-12 MED ORDER — CYANOCOBALAMIN 500 MCG PO TABS
500.0000 ug | ORAL_TABLET | Freq: Every day | ORAL | Status: AC
Start: 2023-05-12 — End: ?

## 2023-05-12 MED ORDER — QSYMIA 7.5-46 MG PO CP24
1.0000 | ORAL_CAPSULE | Freq: Every morning | ORAL | 0 refills | Status: DC
Start: 2023-05-12 — End: 2023-09-13

## 2023-05-12 MED ORDER — METFORMIN HCL 500 MG PO TABS
500.0000 mg | ORAL_TABLET | Freq: Every day | ORAL | 0 refills | Status: DC
Start: 2023-05-12 — End: 2023-11-19
  Filled 2023-05-12: qty 90, 90d supply, fill #0

## 2023-05-16 ENCOUNTER — Encounter: Payer: Self-pay | Admitting: Bariatrics

## 2023-05-17 ENCOUNTER — Other Ambulatory Visit (HOSPITAL_COMMUNITY): Payer: Self-pay

## 2023-06-09 ENCOUNTER — Encounter: Payer: Self-pay | Admitting: Bariatrics

## 2023-06-09 NOTE — Telephone Encounter (Signed)
Just an FYI from patient.

## 2023-06-26 ENCOUNTER — Other Ambulatory Visit: Payer: Self-pay | Admitting: Family Medicine

## 2023-06-26 DIAGNOSIS — F330 Major depressive disorder, recurrent, mild: Secondary | ICD-10-CM

## 2023-09-13 ENCOUNTER — Telehealth: Payer: Self-pay | Admitting: Internal Medicine

## 2023-09-13 ENCOUNTER — Ambulatory Visit: Payer: BC Managed Care – PPO | Admitting: Bariatrics

## 2023-09-13 ENCOUNTER — Encounter: Payer: Self-pay | Admitting: Bariatrics

## 2023-09-13 VITALS — BP 109/71 | HR 75 | Temp 97.7°F | Ht 66.0 in | Wt 249.0 lb

## 2023-09-13 DIAGNOSIS — R197 Diarrhea, unspecified: Secondary | ICD-10-CM

## 2023-09-13 DIAGNOSIS — R632 Polyphagia: Secondary | ICD-10-CM

## 2023-09-13 DIAGNOSIS — R198 Other specified symptoms and signs involving the digestive system and abdomen: Secondary | ICD-10-CM | POA: Diagnosis not present

## 2023-09-13 DIAGNOSIS — Z6841 Body Mass Index (BMI) 40.0 and over, adult: Secondary | ICD-10-CM

## 2023-09-13 DIAGNOSIS — E669 Obesity, unspecified: Secondary | ICD-10-CM | POA: Diagnosis not present

## 2023-09-13 MED ORDER — QSYMIA 7.5-46 MG PO CP24: ORAL_CAPSULE | ORAL | 0 refills | Status: DC

## 2023-09-13 NOTE — Progress Notes (Addendum)
WEIGHT SUMMARY AND BIOMETRICS  Weight Lost Since Last Visit: 0  Weight Gained Since Last Visit: 10lb   Vitals Temp: 97.7 F (36.5 C) BP: 109/71 Pulse Rate: 75 SpO2: 98 %   Anthropometric Measurements Height: 5\' 6"  (1.676 m) Weight: 249 lb (112.9 kg) BMI (Calculated): 40.21 Weight at Last Visit: 239lb Weight Lost Since Last Visit: 0 Weight Gained Since Last Visit: 10lb Starting Weight: 257lb Total Weight Loss (lbs): 8 lb (3.629 kg)   Body Composition  Body Fat %: 47.4 % Fat Mass (lbs): 118.4 lbs Muscle Mass (lbs): 124.8 lbs Total Body Water (lbs): 91.2 lbs Visceral Fat Rating : 10   Other Clinical Data Fasting: yes Labs: no Today's Visit #: 13 Starting Date: 05/12/22    OBESITY Carol Walton is here to discuss her progress with her obesity treatment plan along with follow-up of her obesity related diagnoses.    Nutrition Plan: keeping a food journal with goal of 1400 calories and 80 grams of protein daily - 85% adherence.  Current exercise: walking  Interim History:  She is up 10 pounds since her last visit.  In the interim before this visit she had seen an endocrinologist who suspected that she may have Cushing's syndrome but this was not confirmed.  She is also having diarrhea on a regular basis.  She has not seen GI.  See her findings from her CAT scan of her abdomen and pelvis with contrast that was done on 05/23/2023 showing the following: Possible irritable bowel disease, ischemic bowel disease, and/or infectious bowel disease.  Eating all of the food on the plan., Protein intake is as prescribed, Is not skipping meals, and Water intake is adequate.   Hunger is moderately controlled.  Cravings are well controlled.  Assessment/Plan:   Diarrhea:   She has unspecified diarrhea.  She wasin the hospital back in August 2024 for bowel issues.  She has  not had an official workup since this visit, and continues to be symptomatic.  She is not taking any medications in regard to the diarrhea.   Plan: Referral to [facility] for diarrhea and for a follow-up in relation to her CAT scan of her pelvis and abdomen that was done on 03/24/2023.  Suspected inflammatory bowel disease:  She is also having diarrhea on a regular basis.  She has not seen GI.  See her findings from her CAT scan of her abdomen and pelvis with contrast that was done on 05/23/2023 showing the following: Possible irritable bowel disease, ischemic bowel disease, and/or infectious bowel disease.   Plan: Referral to [facility].   Polyphagia Carol Walton endorses excessive hunger at times but not regularly.  Medication(s): none.  She had tried metformin in the past but is having bowel issues, putting her back on metformin at this point would not be appropriate Plan: Medication(s): Qsymia 7.5/46 mg 1 capsule by mouth daily  in am Will increase water, protein and fiber to help assuage hunger.  Will minimize foods that have a high glucose index/load to minimize reactive hypoglycemia.    Generalized Obesity: Current BMI BMI (Calculated): 40.21   Pharmacotherapy Plan Start  Qsymia 7.5/46 mg 1 capsule by mouth daily in am  Carol Walton is currently in the action stage of change. As such, her goal is to continue with weight loss efforts.  She has agreed to keeping a food journal with goal of 1,400 calories and 80 grams of protein daily. Discussed a low carbohydrate diet and she will do this until her next visit.   Exercise goals: For substantial health benefits, adults should do at least 150 minutes (2 hours and 30 minutes) a week of moderate-intensity, or 75 minutes (1 hour and 15 minutes) a week of vigorous-intensity aerobic physical activity, or an equivalent combination of moderate- and vigorous-intensity aerobic activity. Aerobic activity should be performed in episodes of at least 10 minutes,  and preferably, it should be spread throughout the week.  Behavioral modification strategies: increasing lean protein intake, decreasing simple carbohydrates , no meal skipping, meal planning , planning for success, increasing fiber rich foods, get rid of junk food in the home, keep healthy foods in the home, weigh protein portions, and mindful eating.  Carol Walton has agreed to follow-up with our clinic after she checks her school schedule.   Objective:   VITALS: Per patient if applicable, see vitals. GENERAL: Alert and in no acute distress. CARDIOPULMONARY: No increased WOB. Speaking in clear sentences.  PSYCH: Pleasant and cooperative. Speech normal rate and rhythm. Affect is appropriate. Insight and judgement are appropriate. Attention is focused, linear, and appropriate.  NEURO: Oriented as arrived to appointment on time with no prompting.   Attestation Statements:   This was prepared with the assistance of Engineer, civil (consulting).  Occasional wrong-word or sound-a-like substitutions may have occurred due to the inherent limitations of voice recognition   Corinna Capra, DO

## 2023-09-13 NOTE — Telephone Encounter (Signed)
Patient's mother had contacted Maralyn Sago Monday RN about appointment in GI - in August patient had enteritis on CT  Asking for GI eval due to persistent problems  I will see her - she is home on school break  If 12/26 appointments still open would offer one of those

## 2023-09-13 NOTE — Telephone Encounter (Signed)
Left message for pt to call back  °

## 2023-09-14 NOTE — Telephone Encounter (Signed)
Pt made aware of Dr. Leone Payor recommendations: Pt stated that she would be out of town on 09/23/2023. Pt was scheduled for an office visit on 09/17/2023 at 9:10 AM. Pt made aware.  Pt verbalized understanding with all questions answered.

## 2023-09-16 NOTE — Progress Notes (Signed)
Error

## 2023-09-17 ENCOUNTER — Encounter: Payer: Self-pay | Admitting: Internal Medicine

## 2023-09-17 ENCOUNTER — Other Ambulatory Visit (INDEPENDENT_AMBULATORY_CARE_PROVIDER_SITE_OTHER): Payer: BC Managed Care – PPO

## 2023-09-17 ENCOUNTER — Ambulatory Visit: Payer: BC Managed Care – PPO | Admitting: Internal Medicine

## 2023-09-17 VITALS — BP 120/76 | HR 93 | Ht 66.0 in | Wt 254.0 lb

## 2023-09-17 DIAGNOSIS — K529 Noninfective gastroenteritis and colitis, unspecified: Secondary | ICD-10-CM

## 2023-09-17 DIAGNOSIS — R1033 Periumbilical pain: Secondary | ICD-10-CM | POA: Diagnosis not present

## 2023-09-17 DIAGNOSIS — R112 Nausea with vomiting, unspecified: Secondary | ICD-10-CM

## 2023-09-17 DIAGNOSIS — R197 Diarrhea, unspecified: Secondary | ICD-10-CM

## 2023-09-17 LAB — CBC WITH DIFFERENTIAL/PLATELET
Basophils Absolute: 0 10*3/uL (ref 0.0–0.1)
Basophils Relative: 0.5 % (ref 0.0–3.0)
Eosinophils Absolute: 0.1 10*3/uL (ref 0.0–0.7)
Eosinophils Relative: 1.8 % (ref 0.0–5.0)
HCT: 39.8 % (ref 36.0–46.0)
Hemoglobin: 13 g/dL (ref 12.0–15.0)
Lymphocytes Relative: 29.4 % (ref 12.0–46.0)
Lymphs Abs: 2.3 10*3/uL (ref 0.7–4.0)
MCHC: 32.8 g/dL (ref 30.0–36.0)
MCV: 81.8 fL (ref 78.0–100.0)
Monocytes Absolute: 0.5 10*3/uL (ref 0.1–1.0)
Monocytes Relative: 5.9 % (ref 3.0–12.0)
Neutro Abs: 4.8 10*3/uL (ref 1.4–7.7)
Neutrophils Relative %: 62.4 % (ref 43.0–77.0)
Platelets: 277 10*3/uL (ref 150.0–400.0)
RBC: 4.87 Mil/uL (ref 3.87–5.11)
RDW: 13.5 % (ref 11.5–14.6)
WBC: 7.7 10*3/uL (ref 4.5–10.5)

## 2023-09-17 LAB — COMPREHENSIVE METABOLIC PANEL
ALT: 15 U/L (ref 0–35)
AST: 13 U/L (ref 0–37)
Albumin: 4.1 g/dL (ref 3.5–5.2)
Alkaline Phosphatase: 60 U/L (ref 39–117)
BUN: 11 mg/dL (ref 6–23)
CO2: 27 meq/L (ref 19–32)
Calcium: 9 mg/dL (ref 8.4–10.5)
Chloride: 103 meq/L (ref 96–112)
Creatinine, Ser: 0.87 mg/dL (ref 0.40–1.20)
GFR: 95.67 mL/min (ref >60.00)
Glucose, Bld: 96 mg/dL (ref 70–99)
Potassium: 4.2 meq/L (ref 3.5–5.1)
Sodium: 138 meq/L (ref 135–145)
Total Bilirubin: 0.3 mg/dL (ref 0.2–1.2)
Total Protein: 7.2 g/dL (ref 6.0–8.3)

## 2023-09-17 LAB — C-REACTIVE PROTEIN: CRP: 1.7 mg/dL (ref 0.5–20.0)

## 2023-09-17 LAB — TSH: TSH: 1.7 u[IU]/mL (ref 0.35–5.50)

## 2023-09-17 MED ORDER — ONDANSETRON 4 MG PO TBDP: 4.0000 mg | ORAL_TABLET | Freq: Three times a day (TID) | ORAL | 0 refills | Status: DC | PRN

## 2023-09-17 NOTE — Progress Notes (Signed)
Carol Walton 22 y.o. 2000-10-27 [id number]  Assessment & Plan:   Encounter Diagnoses  Name Primary?   Diarrhea, unspecified type Yes   Periumbilical abdominal pain    Enteritis on CT 8/24    Nausea and vomiting, unspecified vomiting type    Cause of problems not clear.  The enteritis could have been a transient issue in the setting of possible IBS or could represent chronic inflammatory bowel disease specifically Crohn's disease.  She needs workup with an EGD and a colonoscopy.  She wants to talk to her parents to figure out the best time to do this as she returns to school in mid January.  In the meantime I have prescribed ondansetron and performed a lab workup as below.  Meds ordered this encounter  Medications   ondansetron (ZOFRAN-ODT) 4 MG disintegrating tablet    Sig: Take 1 tablet (4 mg total) by mouth every 8 (eight) hours as needed for nausea or vomiting.    Dispense:  30 tablet    Refill:  0   Orders Placed This Encounter  Procedures   CBC with Differential/Platelet   Comprehensive metabolic panel   IgA   C-reactive protein   TSH   Tissue transglutaminase, IgA       Subjective:   Chief Complaint: abdominal pain, bloated, abnormal Ct of intestine, diarrhea  HPI Discussed the use of AI scribe software for clinical note transcription with the patient, who gave verbal consent to proceed.  The patient, with a history of insulin resistance and possible polycystic ovary syndrome, presents with a chief complaint of severe gastrointestinal symptoms. She reports a longstanding history of an upset stomach and diarrhea, which has recently worsened to the point of urgency and frequent vomiting. The patient describes the diarrhea as often watery, sometimes lasting for up to 30 minutes with severe pain, and at times, complete evacuation of her system within 30 minutes of eating. She also reports periods of constipation, occurring two to three times a month.  The  patient has been experiencing significant abdominal bloating, to the point of appearing pregnant, with a rock-hard abdomen. She reports peri-umbilical pain, which is severe enough to disrupt her sleep. The pain is also associated with vomiting episodes, which occur multiple times a week without apparent trigger. The patient describes the vomiting as a mix of food and liquid, and it often follows severe, sharp pains in her stomach.  In addition to these symptoms, the patient reports occasional heartburn, described as a burning pain in the middle of the chest. The patient denies any tobacco, drug, or alcohol use. She also denies any bleeding, apart from her normal menstrual periods, which she describes as very abnormal and painful.  The patient was recently started on Qsymia, a phentermine topiramate agent, for weight management, but has been off the medication since a CT scan in August. She was advised to restart the medication a month ago but has not due to upcoming GI evaluation. The patient also reports being severely anemic in the past, but her hemoglobin levels have since returned to normal. She is currently on a diet of 1500 calories and 90 grams of protein per day per weight management clinic.   Summary of recent medical encounters: On 09/13/2023 patient was seen by Dr. Corinna Capra discussed obesity treatment plan and at that time she was complaining of diarrhea on a regular basis. She reviewed patients recent CT abdomen pelvis with IV contrast on 05/23/2023 which revealed a mild ileus and moderate  to severe enteritis should be considered with multiple abnormal loops of small bowel with wall thickening.  Inflammatory bowel disease, ischemic bowel disease, and infectious bowel disease these should be considered, no diverticulitis. Patient referred to our office.   On 05/23/2023 presented to [facility] ED with complaints of vomiting, shakiness,abd pain, and diarrhea.  Patient stated she had recently started  metformin.  Lab work WBC 15.8, Hgb 14.6, platelets 294, normal kidney function, normal LFTs, Lipase 5.  CT with likely enteritis.  Unclear if viral etiology versus side effect of metformin.  Patient was given Toradol and Bentyl, then discharged home CT abdomen pelvis with contrast 05/24/2023 IMPRESSION:   The liver and pancreas and spleen and adrenal glands and kidneys are normal. No aortic aneurysm or dissection or nephrolithiasis. Patent portal vein. Normal gallbladder and biliary tree. No pancreatitis. No evidence of appendicitis.   Small to moderate amount of pelvic ascites. Normal urinary bladder and uterus.   Normal lung bases. The osseous structures are normal. Mild mesenteric lymphadenopathy. No evidence of appendicitis or bowel obstruction but a mild ileus and moderate to severe enteritis should be considered with multiple abnormal loops of small bowel with wall thickening. Inflammatory bowel disease, ischemic bowel disease and infectious bowel disease should be considered. No diverticulitis.   No Known Allergies Current Meds  Medication Sig   buPROPion ER (WELLBUTRIN SR) 100 MG 12 hr tablet TAKE 1 TABLET BY MOUTH TWICE A DAY   cyanocobalamin (VITAMIN B12) 500 MCG tablet Take 1 tablet (500 mcg total) by mouth daily.   EPINEPHrine (EPIPEN 2-PAK) 0.3 mg/0.3 mL IJ SOAJ injection Inject 0.3 mg into the muscle as needed for anaphylaxis.   hydrOXYzine (VISTARIL) 25 MG capsule Take 1 capsule (25 mg total) by mouth every 8 (eight) hours as needed for anxiety.   iron polysaccharides (FERREX 150) 150 MG capsule Take 1 capsule (150 mg total) by mouth daily.   metFORMIN (GLUCOPHAGE) 500 MG tablet Take 1 tablet (500 mg total) by mouth daily with breakfast.   NUVARING 0.12-0.015 MG/24HR vaginal ring Place vaginally.   ondansetron (ZOFRAN-ODT) 4 MG disintegrating tablet Take 1 tablet (4 mg total) by mouth every 8 (eight) hours as needed for nausea or vomiting.   Phentermine-Topiramate (QSYMIA) 7.5-46  MG CP24 1 capsule daily in the am   venlafaxine XR (EFFEXOR-XR) 37.5 MG 24 hr capsule TAKE 1 CAPSULE BY MOUTH DAILY WITH BREAKFAST.   Past Medical History:  Diagnosis Date   Allergy    Anemia    Back pain    Depression    Eczema    IBS (irritable bowel syndrome)    Joint pain    SOB (shortness of breath)    Past Surgical History:  Procedure Laterality Date   MYRINGOTOMY     Social History   Social History Narrative   Single - Air traffic controller   No tobacco, drugs, EtOH   No current partners   family history includes Asthma in her father; Depression in her mother; Diabetes in her mother; Hyperlipidemia in her father and mother; Hypertension in her mother; Mental illness in her mother; Obesity in her father and mother; Sleep apnea in her father.   Review of Systems As per HPI otherwise negative  Objective:   Physical Exam BP 120/76   Pulse 93   Ht 5\' 6"  (1.676 m)   Wt 254 lb (115.2 kg)   SpO2 98%   BMI 41.00 kg/m  Well-developed well-nourished obese white woman in no acute distress HEENT: Neck  supple, no lymphadenopathy, no thyromegaly. CHEST: Lungs clear to auscultation. CARDIOVASCULAR: Normal S1, S2, no rubs, murmurs or gallops. ABDOMEN: Very mild tenderness in left lower quadrant and infraumbilical area, no masses palpated and no hepatosplenomegaly.  She has a piercing in the navel.  There are no masses.  Bowel sounds are present. Extremities: are free of edema She is alert and oriented x 3 and has an appropriate affect

## 2023-09-17 NOTE — Patient Instructions (Addendum)
Your provider has requested that you go to the basement level for lab work before leaving today. Press "B" on the elevator. The lab is located at the first door on the left as you exit the elevator.  Due to recent changes in healthcare laws, you may see the results of your imaging and laboratory studies on MyChart before your provider has had a chance to review them.  We understand that in some cases there may be results that are confusing or concerning to you. Not all laboratory results come back in the same time frame and the provider may be waiting for multiple results in order to interpret others.  Please give [place] 48 hours in order for your provider to thoroughly review all the results before contacting the office for clarification of your results.  ___________________________________________________________________________   It has been recommended to you by your physician that you have a(n) EGD and Colonoscopy completed. Per your request, we did not schedule the procedure(s) today. Please contact our office at [phone] should you decide to have the procedure completed. You will be scheduled for a pre-visit and procedure at that time.  I appreciate the opportunity to care for you. Stan Head, MD, [facility]

## 2023-09-18 LAB — TISSUE TRANSGLUTAMINASE, IGA: (tTG) Ab, IgA: <1 U/mL

## 2023-09-18 LAB — IGA: Immunoglobulin A: 164 mg/dL (ref 47–310)

## 2023-09-25 ENCOUNTER — Other Ambulatory Visit (INDEPENDENT_AMBULATORY_CARE_PROVIDER_SITE_OTHER): Payer: Self-pay | Admitting: Bariatrics

## 2023-09-25 ENCOUNTER — Other Ambulatory Visit (INDEPENDENT_AMBULATORY_CARE_PROVIDER_SITE_OTHER): Payer: Self-pay

## 2023-09-25 DIAGNOSIS — D508 Other iron deficiency anemias: Secondary | ICD-10-CM

## 2023-09-27 ENCOUNTER — Other Ambulatory Visit (HOSPITAL_COMMUNITY): Payer: Self-pay

## 2023-09-27 ENCOUNTER — Other Ambulatory Visit: Payer: Self-pay

## 2023-09-27 MED ORDER — POLYSACCHARIDE IRON COMPLEX 150 MG PO CAPS
150.0000 mg | ORAL_CAPSULE | Freq: Every day | ORAL | 0 refills | Status: AC
  Filled 2023-09-27: qty 60, 60d supply, fill #0

## 2023-10-13 ENCOUNTER — Other Ambulatory Visit (HOSPITAL_COMMUNITY): Payer: Self-pay

## 2023-11-16 ENCOUNTER — Encounter: Payer: Self-pay | Admitting: Family Medicine

## 2023-11-17 NOTE — Telephone Encounter (Signed)
Called patient to schedule OV  for medication refill. Per last OV note 12/01/22 patient is to follow-up in 2 months 01/31/23

## 2023-11-19 ENCOUNTER — Telehealth (INDEPENDENT_AMBULATORY_CARE_PROVIDER_SITE_OTHER): Payer: 59 | Admitting: Family Medicine

## 2023-11-19 ENCOUNTER — Other Ambulatory Visit (HOSPITAL_COMMUNITY): Payer: Self-pay

## 2023-11-19 DIAGNOSIS — F330 Major depressive disorder, recurrent, mild: Secondary | ICD-10-CM | POA: Diagnosis not present

## 2023-11-19 DIAGNOSIS — E66812 Obesity, class 2: Secondary | ICD-10-CM

## 2023-11-19 DIAGNOSIS — Z6839 Body mass index (BMI) 39.0-39.9, adult: Secondary | ICD-10-CM | POA: Diagnosis not present

## 2023-11-19 DIAGNOSIS — F419 Anxiety disorder, unspecified: Secondary | ICD-10-CM | POA: Diagnosis not present

## 2023-11-19 MED ORDER — BUPROPION HCL ER (SR) 100 MG PO TB12: 100.0000 mg | ORAL_TABLET | Freq: Two times a day (BID) | ORAL | 3 refills | Status: AC

## 2023-11-19 MED ORDER — VENLAFAXINE HCL ER 37.5 MG PO CP24: 37.5000 mg | ORAL_CAPSULE | Freq: Every day | ORAL | 3 refills | Status: AC

## 2023-11-19 MED ORDER — HYDROXYZINE PAMOATE 25 MG PO CAPS: 25.0000 mg | ORAL_CAPSULE | Freq: Three times a day (TID) | ORAL | 3 refills | Status: AC | PRN

## 2023-11-19 NOTE — Assessment & Plan Note (Signed)
Problem is stable, she is dealing with with a strain as well. She feels like she still needs to continue medication. Continue Effexor XR 37.5 mg daily and hydroxyzine 25 mg 3 times daily as needed for acute anxiety. Currently on CBT every 2 weeks. Follow-up in a year, before if needed.

## 2023-11-19 NOTE — Assessment & Plan Note (Signed)
She is reporting problem as well-controlled with current regimen. Continue Wellbutrin SR 100 mg twice daily and Effexor XR 37.5 mg daily. She is doing CBT every 2 weeks. As far as problem is stable, annual follow-up is appropriate.

## 2023-11-19 NOTE — Progress Notes (Signed)
Virtual Visit via Video Note I connected with Oretha Caprice Varble on 11/19/2023 by a video enabled telemedicine application and verified that I am speaking with the correct person using two identifiers. Location patient: home Location provider:home office Persons participating in the virtual visit: patient, provider, scribe  I discussed the limitations of evaluation and management by telemedicine and the availability of in person appointments. The patient expressed understanding and agreed to proceed.  Chief Complaint  Patient presents with   Follow-up   HPI: Ms. Hellon Vaccarella. Costantino is a 23 y.o. female with a PMHx significant for vitamin D deficiency, prediabetes, HLD, anemia, anxiety, and depression, who is being seen on video today for medication follow up.  She was last seen on  12/01/22. Since her last visit she has seen GI, 09/17/23.  Anxiety/Depression:  Currently on Wellbutrin 100 mg twice daily, Hydroxyzine 25 mg three times daily as needed, and Effexor-XR 37.5 mg daily.  She says the medication is helping and she still needs it because she is still under a lot of stress at school.  She has psychotherapy appointments every other week.   Negative for headache, CP,SOB,palpitations,abdominal pain,N/V,changes in bowel habits,or urinary symptoms.  No longer following with the weight loss clinic.  She says she has lost 10 lbs.  Still following dietary recommendations and going to the gym.  ROS: See pertinent positives and negatives per HPI.  Past Medical History:  Diagnosis Date   Allergy    Anemia    Back pain    Depression    Eczema    IBS (irritable bowel syndrome)    Joint pain    SOB (shortness of breath)     Past Surgical History:  Procedure Laterality Date   MYRINGOTOMY     Family History  Problem Relation Age of Onset   Hyperlipidemia Mother    Diabetes Mother    Mental illness Mother    Hypertension Mother    Depression Mother    Obesity Mother    Obesity Father     Hyperlipidemia Father    Asthma Father    Sleep apnea Father     Social History   Socioeconomic History   Marital status: Single    Spouse name: Not on file   Number of children: Not on file   Years of education: Not on file   Highest education level: Bachelor's degree (e.g., BA, AB, BS)  Occupational History   Occupation: Consulting civil engineer At [employer]  Tobacco Use   Smoking status: Never   Smokeless tobacco: Never  Substance and Sexual Activity   Alcohol use: No   Drug use: No   Sexual activity: Never  Other Topics Concern   Not on file  Social History Narrative   Single - Air traffic controller   No tobacco, drugs, EtOH   No current partners   Social Drivers of Corporate investment banker Strain: Low Risk  (11/19/2023)   Overall Financial Resource Strain (CARDIA)    Difficulty of Paying Living Expenses: Not hard at all  Food Insecurity: No Food Insecurity (11/19/2023)   Hunger Vital Sign    Worried About Running Out of Food in the Last Year: Never true    Ran Out of Food in the Last Year: Never true  Transportation Needs: No Transportation Needs (11/19/2023)   PRAPARE - Administrator, Civil Service (Medical): No    Lack of Transportation (Non-Medical): No  Physical Activity: Sufficiently Active (11/19/2023)   Exercise Vital Sign  Days of Exercise per Week: 5 days    Minutes of Exercise per Session: 40 min  Stress: Stress Concern Present (11/19/2023)   [employer] of Occupational Health - Occupational Stress Questionnaire    Feeling of Stress : Rather much  Social Connections: Moderately Isolated (11/19/2023)   Social Connection and Isolation Panel [NHANES]    Frequency of Communication with Friends and Family: More than three times a week    Frequency of Social Gatherings with Friends and Family: Three times a week    Attends Religious Services: More than 4 times per year    Active Member of Clubs or Organizations: No    Attends Engineer, structural: Not on  file    Marital Status: Never married  Intimate Partner Violence: Not on file    Current Outpatient Medications:    cyanocobalamin (VITAMIN B12) 500 MCG tablet, Take 1 tablet (500 mcg total) by mouth daily., Disp: , Rfl:    EPINEPHrine (EPIPEN 2-PAK) 0.3 mg/0.3 mL IJ SOAJ injection, Inject 0.3 mg into the muscle as needed for anaphylaxis., Disp: 1 each, Rfl: 2   iron polysaccharides (FERREX 150) 150 MG capsule, Take 1 capsule (150 mg total) by mouth daily., Disp: 60 capsule, Rfl: 0   NUVARING 0.12-0.015 MG/24HR vaginal ring, Place vaginally., Disp: , Rfl:    buPROPion ER (WELLBUTRIN SR) 100 MG 12 hr tablet, Take 1 tablet (100 mg total) by mouth 2 (two) times daily., Disp: 180 tablet, Rfl: 3   hydrOXYzine (VISTARIL) 25 MG capsule, Take 1 capsule (25 mg total) by mouth every 8 (eight) hours as needed for anxiety., Disp: 30 capsule, Rfl: 3   venlafaxine XR (EFFEXOR-XR) 37.5 MG 24 hr capsule, Take 1 capsule (37.5 mg total) by mouth daily with breakfast., Disp: 90 capsule, Rfl: 3  EXAM:  VITALS per patient if applicable:Ht 5\' 6"  (1.676 m)   Wt 244 lb (110.7 kg)   LMP 10/23/2023 (Exact Date)   BMI 39.38 kg/m   GENERAL: alert, oriented, appears well and in no acute distress  HEENT: atraumatic, conjunctiva clear, no obvious abnormalities on inspection of external nose and ears  NECK: normal movements of the head and neck  LUNGS: on inspection no signs of respiratory distress, breathing rate appears normal, no obvious gross SOB, gasping or wheezing  CV: no obvious cyanosis  MS: moves all visible extremities without noticeable abnormality  PSYCH/NEURO: pleasant and cooperative, no obvious depression or anxiety, speech and thought processing grossly intact  ASSESSMENT AND PLAN:  Discussed the following assessment and plan:  Depression, major, recurrent, mild (HCC) Assessment & Plan: She is reporting problem as well-controlled with current regimen. Continue Wellbutrin SR 100 mg twice  daily and Effexor XR 37.5 mg daily. She is doing CBT every 2 weeks. As far as problem is stable, annual follow-up is appropriate.  Orders: -     buPROPion HCl ER (SR); Take 1 tablet (100 mg total) by mouth 2 (two) times daily.  Dispense: 180 tablet; Refill: 3 -     Venlafaxine HCl ER; Take 1 capsule (37.5 mg total) by mouth daily with breakfast.  Dispense: 90 capsule; Refill: 3  Anxiety disorder, unspecified type Assessment & Plan: Problem is stable, she is dealing with with a strain as well. She feels like she still needs to continue medication. Continue Effexor XR 37.5 mg daily and hydroxyzine 25 mg 3 times daily as needed for acute anxiety. Currently on CBT every 2 weeks. Follow-up in a year, before if needed.  Orders: -  hydrOXYzine Pamoate; Take 1 capsule (25 mg total) by mouth every 8 (eight) hours as needed for anxiety.  Dispense: 30 capsule; Refill: 3 -     Venlafaxine HCl ER; Take 1 capsule (37.5 mg total) by mouth daily with breakfast.  Dispense: 90 capsule; Refill: 3  Class 2 obesity without serious comorbidity with body mass index (BMI) of 39.0 to 39.9 in adult, unspecified obesity type Reports 10 Lb wt loss. She [facility], not longer doing so but still following dietary recommendations.  We discussed possible serious and likely etiologies, options for evaluation and workup, limitations of telemedicine visit vs in person visit, treatment, treatment risks and precautions. The patient was advised to call back or seek an in-person evaluation if the symptoms worsen or if the condition fails to improve as anticipated. I discussed the assessment and treatment plan with the patient. The patient was provided an opportunity to ask questions and all were answered. The patient agreed with the plan and demonstrated an understanding of the instructions.  Return in about 1 year (around 11/18/2024) for chronic problems.  I, Rolla Etienne Wierda,  acting as a scribe for Rielle Schlauch [place], MD., have documented all relevant documentation on the behalf of Jawon Dipiero [place], MD, as directed by  Antonius Hartlage [place], MD while in the presence of Ahtziri Jeffries [place], MD.   I, Nera Haworth [place], MD, have reviewed all documentation for this visit. The documentation on 11/19/23 for the exam, diagnosis, procedures, and orders are all accurate and complete.  Ana Woodroof [place], MD
